# Patient Record
Sex: Female | Born: 1980 | Race: White | Hispanic: No | Smoking: Current every day smoker
Health system: Southern US, Community
[De-identification: ages and names within clinical notes are randomized; demographics above are authoritative.]

## PROBLEM LIST (undated history)

## (undated) DIAGNOSIS — K760 Fatty (change of) liver, not elsewhere classified: Secondary | ICD-10-CM

## (undated) DIAGNOSIS — C801 Malignant (primary) neoplasm, unspecified: Secondary | ICD-10-CM

## (undated) DIAGNOSIS — I2510 Atherosclerotic heart disease of native coronary artery without angina pectoris: Secondary | ICD-10-CM

## (undated) DIAGNOSIS — R569 Unspecified convulsions: Secondary | ICD-10-CM

## (undated) DIAGNOSIS — G4700 Insomnia, unspecified: Secondary | ICD-10-CM

## (undated) DIAGNOSIS — F419 Anxiety disorder, unspecified: Secondary | ICD-10-CM

## (undated) DIAGNOSIS — E785 Hyperlipidemia, unspecified: Secondary | ICD-10-CM

## (undated) DIAGNOSIS — M549 Dorsalgia, unspecified: Secondary | ICD-10-CM

## (undated) DIAGNOSIS — G4730 Sleep apnea, unspecified: Secondary | ICD-10-CM

## (undated) DIAGNOSIS — F329 Major depressive disorder, single episode, unspecified: Secondary | ICD-10-CM

## (undated) DIAGNOSIS — R87619 Unspecified abnormal cytological findings in specimens from cervix uteri: Secondary | ICD-10-CM

## (undated) DIAGNOSIS — F319 Bipolar disorder, unspecified: Secondary | ICD-10-CM

## (undated) DIAGNOSIS — M1990 Unspecified osteoarthritis, unspecified site: Secondary | ICD-10-CM

## (undated) DIAGNOSIS — K219 Gastro-esophageal reflux disease without esophagitis: Secondary | ICD-10-CM

## (undated) DIAGNOSIS — F32A Depression, unspecified: Secondary | ICD-10-CM

## (undated) DIAGNOSIS — M5020 Other cervical disc displacement, unspecified cervical region: Secondary | ICD-10-CM

## (undated) DIAGNOSIS — F4310 Post-traumatic stress disorder, unspecified: Secondary | ICD-10-CM

## (undated) DIAGNOSIS — K589 Irritable bowel syndrome without diarrhea: Secondary | ICD-10-CM

## (undated) HISTORY — PX: ABDOMINAL HYSTERECTOMY: SHX81

## (undated) HISTORY — DX: Hyperlipidemia, unspecified: E78.5

## (undated) HISTORY — DX: Major depressive disorder, single episode, unspecified: F32.9

## (undated) HISTORY — DX: Irritable bowel syndrome, unspecified: K58.9

## (undated) HISTORY — DX: Unspecified convulsions: R56.9

## (undated) HISTORY — DX: Other cervical disc displacement, unspecified cervical region: M50.20

## (undated) HISTORY — DX: Insomnia, unspecified: G47.00

## (undated) HISTORY — DX: Unspecified osteoarthritis, unspecified site: M19.90

## (undated) HISTORY — DX: Bipolar disorder, unspecified: F31.9

## (undated) HISTORY — DX: Anxiety disorder, unspecified: F41.9

## (undated) HISTORY — PX: TUBAL LIGATION: SHX77

## (undated) HISTORY — DX: Unspecified abnormal cytological findings in specimens from cervix uteri: R87.619

## (undated) HISTORY — DX: Fatty (change of) liver, not elsewhere classified: K76.0

## (undated) HISTORY — DX: Dorsalgia, unspecified: M54.9

## (undated) HISTORY — DX: Depression, unspecified: F32.A

## (undated) HISTORY — PX: CHOLECYSTECTOMY: SHX55

## (undated) HISTORY — DX: Post-traumatic stress disorder, unspecified: F43.10

---

## 2016-09-15 DIAGNOSIS — G40919 Epilepsy, unspecified, intractable, without status epilepticus: Secondary | ICD-10-CM | POA: Insufficient documentation

## 2017-04-10 ENCOUNTER — Encounter: Payer: Self-pay | Admitting: Family Medicine

## 2017-04-10 ENCOUNTER — Ambulatory Visit: Payer: Medicaid Other | Admitting: Family Medicine

## 2017-04-10 ENCOUNTER — Ambulatory Visit
Admission: RE | Admit: 2017-04-10 | Discharge: 2017-04-10 | Disposition: A | Discharge status: Home / Self Care | Payer: Medicaid Other | Source: Ambulatory Visit | Attending: Family Medicine | Admitting: Family Medicine

## 2017-04-10 VITALS — BP 130/70 | HR 64 | Ht 71.0 in | Wt 256.0 lb

## 2017-04-10 DIAGNOSIS — Z7689 Persons encountering health services in other specified circumstances: Secondary | ICD-10-CM

## 2017-04-10 DIAGNOSIS — G40209 Localization-related (focal) (partial) symptomatic epilepsy and epileptic syndromes with complex partial seizures, not intractable, without status epilepticus: Secondary | ICD-10-CM

## 2017-04-10 DIAGNOSIS — M542 Cervicalgia: Secondary | ICD-10-CM

## 2017-04-10 DIAGNOSIS — M545 Low back pain, unspecified: Secondary | ICD-10-CM

## 2017-04-10 DIAGNOSIS — F319 Bipolar disorder, unspecified: Secondary | ICD-10-CM | POA: Diagnosis not present

## 2017-04-10 DIAGNOSIS — Z8669 Personal history of other diseases of the nervous system and sense organs: Secondary | ICD-10-CM

## 2017-04-10 MED ORDER — CYCLOBENZAPRINE HCL 10 MG PO TABS: 10.0000 mg | ORAL_TABLET | Freq: Every day | ORAL | 1 refills | Status: DC

## 2017-04-10 NOTE — Progress Notes (Signed)
Name: Christina Jacobson   MRN: [id number]    DOB: November 03, 1980   Date:04/10/2017       Progress Note  Subjective  Chief Complaint  Chief Complaint  Patient presents with  . establishing care    needed new PCP closer to [facility]    Patient to establish care with primary care physician.   Back Pain  This is a chronic problem. The current episode started more than 1 year ago. The problem occurs daily. The problem has been waxing and waning since onset. The pain is present in the lumbar spine. The quality of the pain is described as aching. The pain is moderate. Associated symptoms include abdominal pain, headaches, numbness, paresis, paresthesias and tingling. Pertinent negatives include no bladder incontinence, bowel incontinence, chest pain, dysuria, fever, weakness or weight loss. ([place]bs) Risk factors: remote trauma. She has tried NSAIDs and muscle relaxant for the symptoms. The treatment provided mild relief.  Neck Pain   This is a chronic problem. The current episode started more than 1 year ago. The problem occurs daily. The pain is associated with a remote injury. The pain is present in the midline. Associated symptoms include headaches, numbness, paresis and tingling. Pertinent negatives include no chest pain, fever, weakness or weight loss. She has tried NSAIDs and muscle relaxants for the symptoms. The treatment provided moderate relief.    No problem-specific Assessment & Plan notes found for this encounter.   Past Medical History:  Diagnosis Date  . Abnormal Pap smear of cervix   . Anxiety   . Arthritis   . 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 Bipolar disorder ([place])   . Depression   . Fatty liver   . Herniated disc, cervical   . Hyperlipidemia   . Insomnia   . Irritable bowel syndrome   . PTSD (post-traumatic stress disorder)   . Seizures ([place])    petit mal    Past Surgical History:  Procedure Laterality Date  . TUBAL LIGATION       Family History  Problem Relation Age of Onset  . Diabetes Father   . Diabetes Paternal Grandmother     Social History   Socioeconomic History  . Marital status: Widowed    Spouse name: Not on file  . Number of children: Not on file  . Years of education: Not on file  . Highest education level: Not on file  Social Needs  . Financial resource strain: Not on file  . Food insecurity - worry: Not on file  . Food insecurity - inability: Not on file  . Transportation needs - medical: Not on file  . Transportation needs - non-medical: Not on file  Occupational History  . Not on file  Tobacco Use  . Smoking status: Current Every Day Smoker    Types: Cigarettes  . Smokeless tobacco: Never Used  Substance and Sexual Activity  . Alcohol use: No    Frequency: Never  . Drug use: No  . Sexual activity: Not on file  Other Topics Concern  . Not on file  Social History Narrative  . Not on file    Allergies not on file  Outpatient Medications Prior to Visit  Medication Sig Dispense Refill  . CRANBERRY EXTRACT PO Take 2 tablets daily by mouth.    . cyanocobalamin 2000 MCG tablet Take 2,000 mcg daily by mouth.    . cyclobenzaprine (FLEXERIL) 10 MG tablet Take 1 tablet at bedtime  by mouth.    . Diphenhydramine-APAP, sleep, (TYLENOL PM EXTRA STRENGTH PO) Take 500 mg daily by mouth.    Marland Kitchen HYDROcodone-acetaminophen (NORCO/VICODIN) 5-325 MG tablet Take 1 tablet every 8 (eight) hours as needed by mouth for moderate pain.    . Ibuprofen (ADVIL) 200 MG CAPS Take 1 capsule as needed by mouth. otc    . Lactobacillus (ACIDOPHILUS EXTRA STRENGTH) CAPS Take 1 capsule daily by mouth.    . levETIRAcetam (KEPPRA) 1000 MG tablet Take 500 mg 2 (two) times daily by mouth.    . Omega 3 1000 MG CAPS Take 1 capsule daily by mouth.    Marland Kitchen omeprazole (PRILOSEC) 20 MG capsule Take 1 capsule daily by mouth.     No facility-administered medications prior to visit.     Review of Systems  Constitutional:  Negative for chills, fever, malaise/fatigue and weight loss.  HENT: Negative for ear discharge, ear pain and sore throat.   Eyes: Negative for blurred vision.  Respiratory: Negative for cough, sputum production, shortness of breath and wheezing.   Cardiovascular: Negative for chest pain, palpitations and leg swelling.  Gastrointestinal: Positive for abdominal pain. Negative for blood in stool, bowel incontinence, constipation, diarrhea, heartburn, melena and nausea.  Genitourinary: Negative for bladder incontinence, dysuria, frequency, hematuria and urgency.  Musculoskeletal: Positive for back pain and neck pain. Negative for joint pain and myalgias.  Skin: Negative for rash.  Neurological: Positive for tingling, numbness, headaches and paresthesias. Negative for dizziness, sensory change, focal weakness and weakness.  Endo/Heme/Allergies: Negative for environmental allergies and polydipsia. Does not bruise/bleed easily.  Psychiatric/Behavioral: Negative for depression and suicidal ideas. The patient is not nervous/anxious and does not have insomnia.      Objective  Vitals:   04/10/17 1500  BP: 130/70  Pulse: 64  Weight: 256 lb (116.1 kg)  Height: 5\' 11"  (1.803 m)    Physical Exam  Constitutional: She is well-developed, well-nourished, and in no distress. No distress.  HENT:  Head: Normocephalic and atraumatic.  Right Ear: Tympanic membrane, external ear and ear canal normal. Decreased hearing is noted.  Left Ear: Tympanic membrane, external ear and ear canal normal. Decreased hearing is noted.  Nose: Nose normal.  Mouth/Throat: Oropharynx is clear and moist and mucous membranes are normal. No oropharyngeal exudate, posterior oropharyngeal edema or posterior oropharyngeal erythema.  Eyes: Conjunctivae and EOM are normal. Pupils are equal, round, and reactive to light. Right eye exhibits no discharge. Left eye exhibits no discharge.  Neck: Normal range of motion. Neck supple. No JVD  present. No thyromegaly present.  Cardiovascular: Normal rate, regular rhythm, S1 normal, S2 normal, normal heart sounds and intact distal pulses. Exam reveals no gallop and no friction rub.  No murmur heard. Pulmonary/Chest: Effort normal and breath sounds normal. She has no wheezes. She has no rales.  Abdominal: Soft. Bowel sounds are normal. She exhibits no mass. There is no tenderness. There is no guarding.  Musculoskeletal: Normal range of motion. She exhibits no edema.  Lymphadenopathy:    She has no cervical adenopathy.  Neurological: She is alert. She has normal strength, normal reflexes and intact cranial nerves. A sensory deficit is present.  Skin: Skin is warm and dry. She is not diaphoretic.  Psychiatric: Mood and affect normal.  Nursing note and vitals reviewed.     Assessment & Plan  Problem List Items Addressed This Visit    None      No orders of the defined types were placed in this encounter.  Await xrays- send to physical therapy depending on results   Dr. Otilio Miu [facility]  04/10/17

## 2017-04-11 ENCOUNTER — Other Ambulatory Visit: Payer: Self-pay

## 2017-04-11 DIAGNOSIS — M545 Low back pain, unspecified: Secondary | ICD-10-CM

## 2017-04-11 DIAGNOSIS — M542 Cervicalgia: Secondary | ICD-10-CM

## 2017-04-30 ENCOUNTER — Ambulatory Visit: Payer: Medicaid Other | Attending: Family Medicine | Admitting: Physical Therapy

## 2017-04-30 DIAGNOSIS — M542 Cervicalgia: Secondary | ICD-10-CM | POA: Diagnosis present

## 2017-04-30 DIAGNOSIS — G8929 Other chronic pain: Secondary | ICD-10-CM | POA: Diagnosis present

## 2017-04-30 DIAGNOSIS — M6281 Muscle weakness (generalized): Secondary | ICD-10-CM | POA: Diagnosis present

## 2017-04-30 DIAGNOSIS — M545 Low back pain: Secondary | ICD-10-CM | POA: Diagnosis present

## 2017-05-02 ENCOUNTER — Encounter: Payer: Self-pay | Admitting: Physical Therapy
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
## 2017-05-07 ENCOUNTER — Ambulatory Visit: Payer: Medicaid Other | Admitting: Physical Therapy

## 2017-05-07 DIAGNOSIS — M542 Cervicalgia: Principal | ICD-10-CM

## 2017-05-07 DIAGNOSIS — M545 Low back pain: Secondary | ICD-10-CM

## 2017-05-07 DIAGNOSIS — G8929 Other chronic pain: Secondary | ICD-10-CM

## 2017-05-07 DIAGNOSIS — M6281 Muscle weakness (generalized): Secondary | ICD-10-CM

## 2017-05-09 ENCOUNTER — Encounter: Payer: Self-pay | Admitting: Physical Therapy
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
## 2017-05-14 ENCOUNTER — Ambulatory Visit: Payer: Medicaid Other | Admitting: Physical Therapy

## 2017-05-14 DIAGNOSIS — G8929 Other chronic pain: Secondary | ICD-10-CM

## 2017-05-14 DIAGNOSIS — M542 Cervicalgia: Principal | ICD-10-CM

## 2017-05-14 DIAGNOSIS — M6281 Muscle weakness (generalized): Secondary | ICD-10-CM

## 2017-05-14 DIAGNOSIS — M545 Low back pain, unspecified: Secondary | ICD-10-CM

## 2017-05-16 ENCOUNTER — Encounter: Payer: Self-pay | Admitting: Physical Therapy
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
## 2017-05-21 ENCOUNTER — Encounter: Payer: Self-pay | Admitting: Physical Therapy

## 2017-05-21 ENCOUNTER — Ambulatory Visit: Payer: Medicaid Other | Admitting: Physical Therapy

## 2017-05-21 DIAGNOSIS — M545 Low back pain, unspecified: Secondary | ICD-10-CM

## 2017-05-21 DIAGNOSIS — M542 Cervicalgia: Principal | ICD-10-CM

## 2017-05-21 DIAGNOSIS — M6281 Muscle weakness (generalized): Secondary | ICD-10-CM

## 2017-05-21 DIAGNOSIS — G8929 Other chronic pain: Secondary | ICD-10-CM

## 2017-05-23 NOTE — Therapy (Signed)
[place] [facility] [address] [place], [place], [address] Phone: [phone]   Fax:  [phone]  Physical Therapy Treatment  Patient Details  Name: Christina Jacobson MRN: [id number] Date of Birth: [birth year] Referring Provider: Dr. Otilio Miu   Encounter Date: 05/21/2017  PT End of Session - 05/23/17 1459    Visit Number  4    Number of Visits  4    Date for PT Re-Evaluation  05/28/17    PT Start Time  1103    PT Stop Time  1209    PT Time Calculation (min)  66 min    Activity Tolerance  Patient tolerated treatment well;Patient limited by pain    Behavior During Therapy  [facility] for tasks assessed/performed       Past Medical History:  Diagnosis Date  . Abnormal Pap smear of cervix   . Anxiety   . Arthritis   . 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 Bipolar disorder ([place])   . Depression   . Fatty liver   . Herniated disc, cervical   . Hyperlipidemia   . Insomnia   . Irritable bowel syndrome   . PTSD (post-traumatic stress disorder)   . Seizures ([place])    petit mal    Past Surgical History:  Procedure Laterality Date  . TUBAL LIGATION      There were no vitals filed for this visit.      Pt. c/o 9-10/10 neck pain currently and states back/B knee pain is not too bad.         Treatment:   There.ex.: Reviewed supine core stability ex.: bolster bridging with focus on maintaining core activation/ marching/ clamshell/ trunk rotn. Standing lumbar/ seated cervical AROM (all planes)- pain tolerable. Prone press-ups 10x. See new HEP.  Reviewed goals.    Manual tx.: Supine cervical/ UT/ levator stretches 5x each with holds. Supine LE/lumbar stretches. Prone grade II-III PA mobs. To low thoracic/lumbar spine (unilateral) 1x20 sec. L/R shoulder stretches in supine positions (all planes).    MH to back during prone mobs.        Pt. has shown limited improvement with neck pain  over past month.  Pt. has good cervical/lumbar AROM and has been issued progressive core stability/ B UE ex. program.  Pt. reports no pain improvement with manual cervical stretches/ mobilizations.  Pt. encouraged to continue with HEP and contact MD if high levels of pain persist over next few weeks.  Discharge from PT at this time.       PT Education - 05/23/17 1458    Education provided  Yes    Education Details  B UE ex. program/ posture correction    Person(s) Educated  Patient    Methods  Explanation;Demonstration;Handout    Comprehension  Verbalized understanding;Returned demonstration          PT Long Term Goals - 05/23/17 1505      PT LONG TERM GOAL #1   Title  Pt. I with HEP to increase B UE/LE strength to grossly 5/5 MMT to improve pain-free mobility.      Baseline  B UE/ LE strength grossly 4+.5 MMT.  Limited compliance with core stability ex.     Time  4    Period  Weeks    Status  Partially Met      PT LONG TERM GOAL #2   Title  Pt. will decrease NDI to <50%  to improve self-perceived disability.      Baseline  NDI: 80% on 12/5.   NDI: 66% (some improvement noted)      Time  4    Period  Weeks    Status  Partially Met      PT LONG TERM GOAL #3   Title  Pt. will decrease MODI to <40% to improve self-perceived disability.      Baseline  MODI: 63% on 12/5.    MODI: 74% (worse since initial evaluation)- no pain in back reported while completing MODI today.      Time  4    Period  Weeks    Status  Not Met      PT LONG TERM GOAL #4   Title  Pt. will reports <5/10 neck/back pain at worst with household chores/ sleeping position at night.      Baseline  10/10 pain in neck/back with daily tasks and sleeping.      Time  4    Period  Weeks    Status  Not Met            Plan - 05/23/17 1500    Clinical Impression Statement  Pt. has shown limited improvement with neck pain over past month.  Pt. has good cervical/lumbar AROM and has been issued progressive core  stability/ B UE ex. program.  Pt. reports no pain improvement with manual cervical stretches/ mobilizations.  Pt. encouraged to continue with HEP and contact MD if high levels of pain persist over next few weeks.  Discharge from PT at this time.       Clinical Presentation  Unstable    Clinical Decision Making  Moderate    Rehab Potential  Fair    PT Frequency  1x / week    PT Duration  4 weeks    PT Treatment/Interventions  ADLs/Self Care Home Management;Aquatic Therapy;Electrical Stimulation;Cryotherapy;Moist Heat;Gait training;Balance training;Neuromuscular re-education;Functional mobility training;Therapeutic activities;Therapeutic exercise;Patient/family education;Manual techniques;Passive range of motion;Dry needling    PT Next Visit Plan  Discharge visit.  Pt. limited by high levels of pain.      PT Home Exercise Plan  Page 1-2 TrA ex.        Patient will benefit from skilled therapeutic intervention in order to improve the following deficits and impairments:  Pain, Improper body mechanics, Decreased mobility, Postural dysfunction, Decreased activity tolerance, Decreased endurance, Decreased range of motion, Decreased strength, Impaired flexibility  Visit Diagnosis: Chronic neck pain  Chronic midline low back pain without sciatica  Muscle weakness (generalized)     Problem List There are no active problems to display for this patient.  Pura Spice, PT, DPT # [phone] 05/23/2017, 3:10 PM  [place] [facility] [address] [place], [place], [address] Phone: [phone]   Fax:  [phone]  Name: Christina Jacobson MRN: [id number] Date of Birth: Jun 01, 1980

## 2017-06-27 ENCOUNTER — Ambulatory Visit (INDEPENDENT_AMBULATORY_CARE_PROVIDER_SITE_OTHER): Payer: Medicaid Other | Admitting: Family Medicine

## 2017-06-27 ENCOUNTER — Encounter: Payer: Self-pay | Admitting: Family Medicine

## 2017-06-27 VITALS — BP 120/80 | HR 88 | Ht 71.0 in | Wt 202.0 lb

## 2017-06-27 DIAGNOSIS — Z23 Encounter for immunization: Secondary | ICD-10-CM | POA: Diagnosis not present

## 2017-06-27 DIAGNOSIS — J40 Bronchitis, not specified as acute or chronic: Secondary | ICD-10-CM

## 2017-06-27 DIAGNOSIS — J0100 Acute maxillary sinusitis, unspecified: Secondary | ICD-10-CM

## 2017-06-27 MED ORDER — AZITHROMYCIN 250 MG PO TABS: ORAL_TABLET | ORAL | 0 refills | Status: DC

## 2017-06-27 MED ORDER — GUAIFENESIN-CODEINE 100-10 MG/5ML PO SYRP: 5.0000 mL | ORAL_SOLUTION | Freq: Three times a day (TID) | ORAL | 0 refills | Status: DC | PRN

## 2017-06-27 NOTE — Progress Notes (Signed)
Name: Christina Jacobson   MRN: [id number]    DOB: 09/12/1980   Date:06/27/2017       Progress Note  Subjective  Chief Complaint  Chief Complaint  Patient presents with  . Sinusitis    cough- yellow production, cong, sore throat    Sinusitis  This is a new problem. The current episode started in the past 7 days. The problem has been gradually worsening since onset. There has been no fever. The pain is moderate. Associated symptoms include congestion, coughing, diaphoresis, shortness of breath, sinus pressure, sneezing and a sore throat. Pertinent negatives include no chills, ear pain, headaches, hoarse voice, neck pain or swollen glands. Treatments tried: zyrtec. The treatment provided no relief.  Cough  This is a new problem. The current episode started in the past 7 days. The problem has been gradually worsening. The cough is productive of purulent sputum (yellow). Associated symptoms include a sore throat, shortness of breath and wheezing. Pertinent negatives include no chest pain, chills, ear congestion, ear pain, fever, headaches, heartburn, hemoptysis, myalgias, nasal congestion, postnasal drip, rash, rhinorrhea or weight loss. She has tried nothing for the symptoms. There is no history of asthma or environmental allergies.    No problem-specific Assessment & Plan notes found for this encounter.   Past Medical History:  Diagnosis Date  . Abnormal Pap smear of cervix   . Anxiety   . Arthritis   . 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 Bipolar disorder ([place])   . Depression   . Fatty liver   . Herniated disc, cervical   . Hyperlipidemia   . Insomnia   . Irritable bowel syndrome   . PTSD (post-traumatic stress disorder)   . Seizures ([place])    petit mal    Past Surgical History:  Procedure Laterality Date  . TUBAL LIGATION      Family History  Problem Relation Age of Onset  . Diabetes Father   . Diabetes Paternal Grandmother     Social  History   Socioeconomic History  . Marital status: Widowed    Spouse name: Not on file  . Number of children: Not on file  . Years of education: Not on file  . Highest education level: Not on file  Social Needs  . Financial resource strain: Not on file  . Food insecurity - worry: Not on file  . Food insecurity - inability: Not on file  . Transportation needs - medical: Not on file  . Transportation needs - non-medical: Not on file  Occupational History  . Not on file  Tobacco Use  . Smoking status: Current Every Day Smoker    Types: Cigarettes  . Smokeless tobacco: Never Used  Substance and Sexual Activity  . Alcohol use: No    Frequency: Never  . Drug use: No  . Sexual activity: Not on file  Other Topics Concern  . Not on file  Social History Narrative  . Not on file    Allergies  Allergen Reactions  . Penicillins Rash    Outpatient Medications Prior to Visit  Medication Sig Dispense Refill  . CRANBERRY EXTRACT PO Take 2 tablets daily by mouth.    . cyanocobalamin 2000 MCG tablet Take 2,000 mcg daily by mouth.    . cyclobenzaprine (FLEXERIL) 10 MG tablet Take 1 tablet (10 mg total) at bedtime by mouth. 30 tablet 1  . Diphenhydramine-APAP, sleep, (TYLENOL PM EXTRA STRENGTH PO) Take 500 mg  daily by mouth.    . divalproex (DEPAKOTE) 500 MG DR tablet Take 1 tablet by mouth 2 (two) times daily. Dr Manuella Ghazi    . Ibuprofen (ADVIL) 200 MG CAPS Take 1 capsule as needed by mouth. otc    . Lactobacillus (ACIDOPHILUS EXTRA STRENGTH) CAPS Take 1 capsule daily by mouth.    . Omega 3 1000 MG CAPS Take 1 capsule daily by mouth.    Marland Kitchen omeprazole (PRILOSEC) 20 MG capsule Take 1 capsule daily by mouth.    Marland Kitchen HYDROcodone-acetaminophen (NORCO/VICODIN) 5-325 MG tablet Take 1 tablet every 8 (eight) hours as needed by mouth for moderate pain.    Marland Kitchen levETIRAcetam (KEPPRA) 1000 MG tablet Take 500 mg 2 (two) times daily by mouth.     No facility-administered medications prior to visit.     Review  of Systems  Constitutional: Positive for diaphoresis. Negative for chills, fever, malaise/fatigue and weight loss.  HENT: Positive for congestion, sinus pressure, sneezing and sore throat. Negative for ear discharge, ear pain, hoarse voice, postnasal drip and rhinorrhea.   Eyes: Negative for blurred vision.  Respiratory: Positive for cough, shortness of breath and wheezing. Negative for hemoptysis and sputum production.   Cardiovascular: Negative for chest pain, palpitations and leg swelling.  Gastrointestinal: Negative for abdominal pain, blood in stool, constipation, diarrhea, heartburn, melena and nausea.  Genitourinary: Negative for dysuria, frequency, hematuria and urgency.  Musculoskeletal: Negative for back pain, joint pain, myalgias and neck pain.  Skin: Negative for rash.  Neurological: Negative for dizziness, tingling, sensory change, focal weakness and headaches.  Endo/Heme/Allergies: Negative for environmental allergies and polydipsia. Does not bruise/bleed easily.  Psychiatric/Behavioral: Negative for depression and suicidal ideas. The patient is not nervous/anxious and does not have insomnia.      Objective  Vitals:   06/27/17 0821  BP: 120/80  Pulse: 88  Weight: 202 lb (91.6 kg)  Height: 5\' 11"  (1.803 m)    Physical Exam  Constitutional: She is well-developed, well-nourished, and in no distress. No distress.  HENT:  Head: Normocephalic and atraumatic.  Right Ear: Tympanic membrane, external ear and ear canal normal.  Left Ear: External ear and ear canal normal. A middle ear effusion is present.  Nose: Mucosal edema present. Right sinus exhibits maxillary sinus tenderness. Right sinus exhibits no frontal sinus tenderness. Left sinus exhibits no maxillary sinus tenderness and no frontal sinus tenderness.  Mouth/Throat: Posterior oropharyngeal erythema present. No oropharyngeal exudate or posterior oropharyngeal edema.  Postnasal yellow  Eyes: Conjunctivae and EOM are  normal. Pupils are equal, round, and reactive to light. Right eye exhibits no discharge. Left eye exhibits no discharge.  Neck: Normal range of motion. Neck supple. No JVD present. No thyromegaly present.  Cardiovascular: Normal rate, regular rhythm, normal heart sounds and intact distal pulses. Exam reveals no gallop and no friction rub.  No murmur heard. Pulmonary/Chest: Effort normal. No respiratory distress. She has no wheezes. She has no rales.  Abdominal: Soft. Bowel sounds are normal. She exhibits no mass. There is no tenderness. There is no guarding.  Musculoskeletal: Normal range of motion. She exhibits no edema.  Lymphadenopathy:    She has no cervical adenopathy.  Neurological: She is alert. She has normal reflexes.  Skin: Skin is warm and dry. She is not diaphoretic.  Psychiatric: Mood and affect normal.  Nursing note and vitals reviewed.     Assessment & Plan  Problem List Items Addressed This Visit    None    Visit Diagnoses  Acute maxillary sinusitis, recurrence not specified    -  Primary   Relevant Medications   azithromycin (ZITHROMAX) 250 MG tablet   guaiFENesin-codeine (ROBITUSSIN AC) 100-10 MG/5ML syrup   Bronchitis       Relevant Medications   guaiFENesin-codeine (ROBITUSSIN AC) 100-10 MG/5ML syrup   Need for diphtheria-tetanus-pertussis (Tdap) vaccine       Relevant Orders   Tdap vaccine greater than or equal to 7yo IM (Completed)      Meds ordered this encounter  Medications  . azithromycin (ZITHROMAX) 250 MG tablet    Sig: 2 today then 1 a day for 4 day    Dispense:  6 tablet    Refill:  0  . guaiFENesin-codeine (ROBITUSSIN AC) 100-10 MG/5ML syrup    Sig: Take 5 mLs by mouth 3 (three) times daily as needed for cough.    Dispense:  100 mL    Refill:  0      Dr. Otilio Miu [facility]  06/27/17

## 2017-06-27 NOTE — Patient Instructions (Signed)
Health Risks of Smoking  Smoking cigarettes is very bad for your health. Tobacco smoke has over 200 known poisons in it. It contains the poisonous gases nitrogen oxide and carbon monoxide. There are over 60 chemicals in tobacco smoke that cause cancer.  Smoking is difficult to quit because a chemical in tobacco, called nicotine, causes addiction or dependence. When you smoke and inhale, nicotine is absorbed rapidly into the bloodstream through your lungs. Both inhaled and non-inhaled nicotine may be addictive.  What are the risks of cigarette smoke?  Cigarette smokers have an increased risk of many serious medical problems, including:  · Lung cancer.  · Lung disease, such as pneumonia, bronchitis, and emphysema.  · Chest pain (angina) and heart attack because the heart is not getting enough oxygen.  · Heart disease and peripheral blood vessel disease.  · High blood pressure (hypertension).  · Stroke.  · Oral cancer, including cancer of the lip, mouth, or voice box.  · Bladder cancer.  · Pancreatic cancer.  · Cervical cancer.  · Pregnancy complications, including premature birth.  · Stillbirths and smaller newborn babies, birth defects, and genetic damage to sperm.  · Early menopause.  · Lower estrogen level for women.  · Infertility.  · Facial wrinkles.  · Blindness.  · Increased risk of broken bones (fractures).  · Senile dementia.  · Stomach ulcers and internal bleeding.  · Delayed wound healing and increased risk of complications during surgery.  · Even smoking lightly shortens your life expectancy by several years.    Because of secondhand smoke exposure, children of smokers have an increased risk of the following:  · Sudden infant death syndrome (SIDS).  · Respiratory infections.  · Lung cancer.  · Heart disease.  · Ear infections.    What are the benefits of quitting?  There are many health benefits of quitting smoking. Here are some of them:   · Within days of quitting smoking, your risk of having a heart attack decreases, your blood flow improves, and your lung capacity improves. Blood pressure, pulse rate, and breathing patterns start returning to normal soon after quitting.  · Within months, your lungs may clear up completely.  · Quitting for 10 years reduces your risk of developing lung cancer and heart disease to almost that of a nonsmoker.  · People who quit may see an improvement in their overall quality of life.    How do I quit smoking?  Smoking is an addiction with both physical and psychological effects, and longtime habits can be hard to change. Your health care provider can recommend:  · Programs and community resources, which may include group support, education, or talk therapy.  · Prescription medicines to help reduce cravings.  · Nicotine replacement products, such as patches, gum, and nasal sprays. Use these products only as directed. Do not replace cigarette smoking with electronic cigarettes, which are commonly called e-cigarettes. The safety of e-cigarettes is not known, and some may contain harmful chemicals.  · A combination of two or more of these methods.    Where to find more information:  · American Lung Association: www.lung.org  · American Cancer Society: www.cancer.org  Summary  · Smoking cigarettes is very bad for your health. Cigarette smokers have an increased risk of many serious medical problems, including several cancers, heart disease, and stroke.  · Smoking is an addiction with both physical and psychological effects, and longtime habits can be hard to change.  · By stopping right away, you   can greatly reduce the risk of medical problems for you and your family.  · To help you quit smoking, your health care provider can recommend programs, community resources, prescription medicines, and nicotine replacement products such as patches, gum, and nasal sprays.   This information is not intended to replace advice given to you by your health care provider. Make sure you discuss any questions you have with your health care provider.  Document Released: 06/20/2004 Document Revised: 05/17/2016 Document Reviewed: 05/17/2016  Elsevier Interactive Patient Education © 2017 [employer]

## 2017-09-02 ENCOUNTER — Ambulatory Visit: Payer: Medicaid Other | Admitting: Family Medicine

## 2017-09-02 ENCOUNTER — Encounter: Payer: Self-pay | Admitting: Family Medicine

## 2017-09-02 VITALS — BP 120/82 | HR 80 | Ht 71.0 in | Wt 204.0 lb

## 2017-09-02 DIAGNOSIS — Z8742 Personal history of other diseases of the female genital tract: Secondary | ICD-10-CM

## 2017-09-02 DIAGNOSIS — G40209 Localization-related (focal) (partial) symptomatic epilepsy and epileptic syndromes with complex partial seizures, not intractable, without status epilepticus: Secondary | ICD-10-CM

## 2017-09-02 DIAGNOSIS — Z87898 Personal history of other specified conditions: Secondary | ICD-10-CM

## 2017-09-02 DIAGNOSIS — F319 Bipolar disorder, unspecified: Secondary | ICD-10-CM

## 2017-09-02 DIAGNOSIS — F82 Specific developmental disorder of motor function: Secondary | ICD-10-CM | POA: Diagnosis not present

## 2017-09-02 DIAGNOSIS — N938 Other specified abnormal uterine and vaginal bleeding: Secondary | ICD-10-CM

## 2017-09-02 NOTE — Patient Instructions (Signed)
Dysfunctional Uterine Bleeding °Dysfunctional uterine bleeding is abnormal bleeding from the uterus. Dysfunctional uterine bleeding includes: °· A period that comes earlier or later than usual. °· A period that is lighter, heavier, or has blood clots. °· Bleeding between periods. °· Skipping one or more periods. °· Bleeding after sexual intercourse. °· Bleeding after menopause. ° °Follow these instructions at home: °Pay attention to any changes in your symptoms. Follow these instructions to help with your condition: °Eating and drinking °· Eat well-balanced meals. Include foods that are high in iron, such as liver, meat, shellfish, green leafy vegetables, and eggs. °· If you become constipated: °? Drink plenty of water. °? Eat fruits and vegetables that are high in water and fiber, such as spinach, carrots, raspberries, apples, and mango. °Medicines °· Take over-the-counter and prescription medicines only as told by your health care provider. °· Do not change medicines without talking with your health care provider. °· Aspirin or medicines that contain aspirin may make the bleeding worse. Do not take those medicines: °? During the week before your period. °? During your period. °· If you were prescribed iron pills, take them as told by your health care provider. Iron pills help to replace iron that your body loses because of this condition. °Activity °· If you need to change your sanitary pad or tampon more than one time every 2 hours: °? Lie in bed with your feet raised (elevated). °? Place a cold pack on your lower abdomen. °? Rest as much as possible until the bleeding stops or slows down. °· Do not try to lose weight until the bleeding has stopped and your blood iron level is back to normal. °Other Instructions °· For two months, write down: °? When your period starts. °? When your period ends. °? When any abnormal bleeding occurs. °? What problems you notice. °· Keep all follow up visits as told by your health  care provider. This is important. °Contact a health care provider if: °· You get light-headed or weak. °· You have nausea and vomiting. °· You cannot eat or drink without vomiting. °· You feel dizzy or have diarrhea while you are taking medicines. °· You are taking birth control pills or hormones, and you want to change them or stop taking them. °Get help right away if: °· You develop a fever or chills. °· You need to change your sanitary pad or tampon more than one time per hour. °· Your bleeding becomes heavier, or your flow contains clots more often. °· You develop pain in your abdomen. °· You lose consciousness. °· You develop a rash. °This information is not intended to replace advice given to you by your health care provider. Make sure you discuss any questions you have with your health care provider. °Document Released: 05/10/2000 Document Revised: 10/19/2015 Document Reviewed: 08/08/2014 °Elsevier Interactive Patient Education © 2018 [employer] Inc[employer] ° °

## 2017-09-02 NOTE — Progress Notes (Signed)
Name: Christina Jacobson   MRN: [id number]    DOB: 10-Mar-1981   Date:09/02/2017       Progress Note  Subjective  Chief Complaint  Chief Complaint  Patient presents with  . abnormal bleeding    Had a "normal period" in March- started spotting after/ comes and goes. Needs referral to GYN    Vaginal Bleeding  The patient's primary symptoms include missed menses and vaginal bleeding. The patient's pertinent negatives include no genital itching, genital lesions, genital odor, genital rash, pelvic pain or vaginal discharge. This is a new problem. The current episode started 1 to 4 weeks ago (2012/ass with bad pap smear/this episode 10 days). The problem occurs intermittently. The problem has been waxing and waning. The patient is experiencing no pain. She is not pregnant. Associated symptoms include back pain. Pertinent negatives include no abdominal pain, anorexia, chills, constipation, diarrhea, discolored urine, dysuria, fever, flank pain, frequency, headaches, hematuria, joint pain, joint swelling, nausea, painful intercourse, rash, sore throat, urgency or vomiting. The vaginal discharge was normal. The vaginal bleeding is spotting. She has not been passing clots. She has not been passing tissue. Nothing aggravates the symptoms. She has tried nothing for the symptoms. She is sexually active. No, her partner does not have an STD. She uses tubal ligation for contraception. Her menstrual history has been irregular. Her past medical history is significant for a gynecological surgery. There is no history of an abdominal surgery, a Cesarean section, an ectopic pregnancy, endometriosis, herpes simplex, menorrhagia, metrorrhagia, miscarriage, ovarian cysts, perineal abscess, PID, an STD, a terminated pregnancy or vaginosis. (Ctyotherapy)  Neurologic Problem  The patient's primary symptoms include clumsiness, focal sensory loss and focal weakness. The patient's pertinent negatives include no altered mental status, loss of  balance, memory loss, near-syncope, slurred speech, syncope, visual change or weakness. This is a chronic problem. Episode onset: hands /1 weekago. The neurological problem developed suddenly. The problem has been gradually worsening since onset. There was upper extremity (unable to grasp) focality noted. Associated symptoms include back pain. Pertinent negatives include no abdominal pain, bladder incontinence, bowel incontinence, chest pain, confusion, diaphoresis, dizziness, fatigue, fever, headaches, light-headedness, nausea, neck pain, palpitations, shortness of breath, vertigo or vomiting. Past treatments include nothing. There is no history of a bleeding disorder, a clotting disorder, a CVA, dementia, head trauma, liver disease, mood changes or seizures. (Ctyotherapy)    No problem-specific Assessment & Plan notes found for this encounter.   Past Medical History:  Diagnosis Date  . Abnormal Pap smear of cervix   . Anxiety   . Arthritis   . 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 Bipolar disorder ([place])   . Depression   . Fatty liver   . Herniated disc, cervical   . Hyperlipidemia   . Insomnia   . Irritable bowel syndrome   . PTSD (post-traumatic stress disorder)   . Seizures ([place])    petit mal    Past Surgical History:  Procedure Laterality Date  . TUBAL LIGATION      Family History  Problem Relation Age of Onset  . Diabetes Father   . Diabetes Paternal Grandmother     Social History   Socioeconomic History  . Marital status: Widowed    Spouse name: Not on file  . Number of children: Not on file  . Years of education: Not on file  . Highest education level: Not on file  Occupational History  . Not  on file  Social Needs  . Financial resource strain: Not on file  . Food insecurity:    Worry: Not on file    Inability: Not on file  . Transportation needs:    Medical: Not on file    Non-medical: Not on file  Tobacco Use  .  Smoking status: Current Every Day Smoker    Types: Cigarettes  . Smokeless tobacco: Never Used  Substance and Sexual Activity  . Alcohol use: No    Frequency: Never  . Drug use: No  . Sexual activity: Not on file  Lifestyle  . Physical activity:    Days per week: Not on file    Minutes per session: Not on file  . Stress: Not on file  Relationships  . Social connections:    Talks on phone: Not on file    Gets together: Not on file    Attends religious service: Not on file    Active member of club or organization: Not on file    Attends meetings of clubs or organizations: Not on file    Relationship status: Not on file  . Intimate partner violence:    Fear of current or ex partner: Not on file    Emotionally abused: Not on file    Physically abused: Not on file    Forced sexual activity: Not on file  Other Topics Concern  . Not on file  Social History Narrative  . Not on file    Allergies  Allergen Reactions  . Penicillins Rash    Outpatient Medications Prior to Visit  Medication Sig Dispense Refill  . CRANBERRY EXTRACT PO Take 2 tablets daily by mouth.    . cyanocobalamin 2000 MCG tablet Take 2,000 mcg daily by mouth.    . cyclobenzaprine (FLEXERIL) 10 MG tablet Take 1 tablet (10 mg total) at bedtime by mouth. 30 tablet 1  . Diphenhydramine-APAP, sleep, (TYLENOL PM EXTRA STRENGTH PO) Take 500 mg daily by mouth.    . divalproex (DEPAKOTE) 500 MG DR tablet Take 1 tablet by mouth 2 (two) times daily. Dr Manuella Ghazi    . Ibuprofen (ADVIL) 200 MG CAPS Take 1 capsule as needed by mouth. otc    . Lactobacillus (ACIDOPHILUS EXTRA STRENGTH) CAPS Take 1 capsule daily by mouth.    . Omega 3 1000 MG CAPS Take 1 capsule daily by mouth.    Marland Kitchen omeprazole (PRILOSEC) 20 MG capsule Take 1 capsule daily by mouth.    Marland Kitchen azithromycin (ZITHROMAX) 250 MG tablet 2 today then 1 a day for 4 day 6 tablet 0  . guaiFENesin-codeine (ROBITUSSIN AC) 100-10 MG/5ML syrup Take 5 mLs by mouth 3 (three) times  daily as needed for cough. 100 mL 0   No facility-administered medications prior to visit.     Review of Systems  Constitutional: Negative for chills, diaphoresis, fatigue, fever, malaise/fatigue and weight loss.  HENT: Negative for ear discharge, ear pain and sore throat.   Eyes: Negative for blurred vision.  Respiratory: Negative for cough, sputum production, shortness of breath and wheezing.   Cardiovascular: Negative for chest pain, palpitations, leg swelling and near-syncope.  Gastrointestinal: Negative for abdominal pain, anorexia, blood in stool, bowel incontinence, constipation, diarrhea, heartburn, melena, nausea and vomiting.  Genitourinary: Positive for missed menses and vaginal bleeding. Negative for bladder incontinence, dysuria, flank pain, frequency, hematuria, menorrhagia, pelvic pain, urgency and vaginal discharge.  Musculoskeletal: Positive for back pain. Negative for joint pain, myalgias and neck pain.  Skin: Negative for rash.  Neurological: Positive for focal weakness. Negative for dizziness, vertigo, tingling, sensory change, syncope, weakness, light-headedness, headaches and loss of balance.  Endo/Heme/Allergies: Negative for environmental allergies and polydipsia. Does not bruise/bleed easily.  Psychiatric/Behavioral: Negative for confusion, depression, memory loss and suicidal ideas. The patient is not nervous/anxious and does not have insomnia.      Objective  Vitals:   09/02/17 0847  BP: 120/82  Pulse: 80  Weight: 204 lb (92.5 kg)  Height: 5\' 11"  (1.803 m)    Physical Exam  Constitutional: She is well-developed, well-nourished, and in no distress. No distress.  HENT:  Head: Normocephalic and atraumatic.  Right Ear: External ear normal.  Left Ear: External ear normal.  Nose: Nose normal.  Mouth/Throat: Oropharynx is clear and moist.  Eyes: Pupils are equal, round, and reactive to light. Conjunctivae and EOM are normal. Right eye exhibits no discharge.  Left eye exhibits no discharge.  Neck: Normal range of motion. Neck supple. No JVD present. No thyromegaly present.  Cardiovascular: Normal rate, regular rhythm, normal heart sounds and intact distal pulses. Exam reveals no gallop and no friction rub.  No murmur heard. Pulmonary/Chest: Effort normal and breath sounds normal. She has no wheezes. She has no rales.  Abdominal: Soft. Bowel sounds are normal. She exhibits no mass. There is no tenderness. There is no guarding.  Musculoskeletal: Normal range of motion. She exhibits no edema.  Lymphadenopathy:    She has no cervical adenopathy.  Neurological: She is alert. She has normal sensation, normal reflexes and intact cranial nerves. She displays weakness. No cranial nerve deficit or sensory deficit.  Unable to grasp  Skin: Skin is warm and dry. She is not diaphoretic.  Psychiatric: Mood and affect normal.  Nursing note and vitals reviewed.     Assessment & Plan  Problem List Items Addressed This Visit      Other   Bipolar 1 disorder ([place])    Other Visit Diagnoses    Dysfunctional uterine bleeding    -  Primary   Relevant Orders   Ambulatory referral to Gynecology   Partial symptomatic epilepsy with complex partial seizures, not intractable, without status epilepticus ([place])   (Chronic)     Relevant Orders   Ambulatory referral to Neurology   Hx of abnormal cervical Pap smear       Relevant Orders   Ambulatory referral to Gynecology   Motor skill disorder       Relevant Orders   Ambulatory referral to Neurology     sched GYN for 10/14/17 @ 3:15 in [facility] with Dr Leafy Ro No orders of the defined types were placed in this encounter.     Dr. [facility]  09/02/17

## 2019-11-23 NOTE — H&P (Signed)
Christina Jacobson is a 39 y.o. female here for scheduled TVH and bilateral salpingectomy . this patient has a long history of  menorrhagia . Was on Depo provera stopped and restarted and heavy bleeding with clot passage size of  Baseballs .  Menses have been irregular all her life .   H/o of cervical dysplasia and s/p cryotx in past . No dyspareunia  EMBX neg and U/s normal   G12 P 3  All SVD . S/P BTL   seizure d/o on depakote 500 mg BID. Last sz 4 yrs ago . No recent changes to her sz medication  Past Medical History:  has a past medical history of Arthritis, Depression, Dysmenorrhea, IBS (irritable bowel syndrome) (2017), Migraine headache, Seizures (CMS-HCC), and Sleep apnea.  Past Surgical History:  has a past surgical history that includes Tubal ligation. Family History: family history includes Cancer in her maternal grandfather; Developmental delay in her son; Diabetes in her paternal grandmother. Social History:  reports that she has been smoking. She has been smoking about 1.00 pack per day. She has never used smokeless tobacco. She reports that she does not drink alcohol and does not use drugs. OB/GYN History:          OB History    Gravida  12   Para  3   Term  3   Preterm      AB  9   Living  3     SAB      TAB      Ectopic      Molar      Multiple      Live Births             Allergies: is allergic to penicillins. Medications:  Current Outpatient Medications:  .  cranberry 400 mg Cap, Take by mouth, Disp: , Rfl:  .  divalproex (DEPAKOTE) 250 MG DR tablet, Take 250mg  (1 tablet) two times a day for two weeks then take 500mg  (2 tablets) two times a day., Disp: 120 tablet, Rfl: 0 .  escitalopram oxalate (LEXAPRO) 10 MG tablet, Take 10 mg by mouth once daily, Disp: , Rfl:  .  medroxyPROGESTERone (DEPO-PROVERA) 150 mg/mL injection, Inject 1 mL (150 mg total) into the muscle every 3 (three) months, Disp: 1 mL, Rfl: 3 .  NAPROXEN ORAL, Take by mouth   , Disp: ,  Rfl:  .  omeprazole (PRILOSEC) 20 MG DR capsule, TAKE ONE CAPSULE BY MOUTH DAILY FOR REFLUX, Disp: , Rfl: 2 .  cyclobenzaprine (FLEXERIL) 10 MG tablet, TAKE 1 TABLET BY MOUTH AT BEDTIME AS NEEDED FOR MUSCLE PAIN (Patient not taking: Reported on 10/14/2019), Disp: , Rfl: 1 .  docosahexaenoic acid-epa 120-180 mg Cap, Take by mouth (Patient not taking: Reported on 10/14/2019  ), Disp: , Rfl:  .  ergocalciferol, vitamin D2, (VITAMIN D2 ORAL), Take by mouth. (Patient not taking: Reported on 10/14/2019  ), Disp: , Rfl:  .  levETIRAcetam (KEPPRA) 1000 MG tablet, Take 0.5 tablets (500 mg total) by mouth 2 (two) times daily. (Patient not taking: Reported on 05/12/2017 ), Disp: 90 tablet, Rfl: 3 .  omega-3 fatty acids/fish oil (OMEGA 3 FISH OIL ORAL), Take by mouth. (Patient not taking: Reported on 10/14/2019  ), Disp: , Rfl:  .  Saccharomyces boulardii (FLORASTOR) 250 mg capsule, Take 250 mg by mouth 2 (two) times daily (Patient not taking: Reported on 11/01/2019  ), Disp: , Rfl:  .  sertraline (ZOLOFT) 50 MG tablet, Take 50  mg by mouth once daily (Patient not taking: Reported on 10/14/2019  ), Disp: , Rfl:   Review of Systems: General:                      No fatigue or weight loss Eyes:                           No vision changes Ears:                            No hearing difficulty Respiratory:                No cough or shortness of breath Pulmonary:                  No asthma or shortness of breath Cardiovascular:           No chest pain, palpitations, dyspnea on exertion Gastrointestinal:          No abdominal bloating, chronic diarrhea, constipations, masses, pain or hematochezia Genitourinary:             No hematuria, dysuria, abnormal vaginal discharge, pelvic pain, +Menometrorrhagia Lymphatic:                   No swollen lymph nodes Musculoskeletal:         No muscle weakness Neurologic:                  No extremity weakness, syncope, seizure disorder Psychiatric:                  No history of  depression, delusions or suicidal/homicidal ideation    Exam:      Vitals:   11/11/19 1505  BP: 128/86  Pulse: 80    Body mass index is 31.8 kg/m.  WDWN white/ female in NAD   Lungs: CTA  CV : RRR without murmur   Neck:  no thyromegaly Abdomen: soft , no mass, normal active bowel sounds,  non-tender, no rebound tenderness Pelvic: tanner stage 5 ,  External genitalia: vulva /labia no lesions Urethra: no prolapse Vagina: normal physiologic d/c, adequate room for hysterectomy TVH  Cervix: no lesions, no cervical motion tenderness   Uterus: normal size shape and contour, non-tender Adnexa: no mass,  non-tender   Rectovaginal:  Impression:   The primary encounter diagnosis was Menorrhagia with irregular cycle. A diagnosis of History of cervical dysplasia was also pertinent to this visit.    Plan:   After thorough discussion the patient has opted for definitive surgery TVH + bilateral salpingectomy. I have explained the procedure in detail     Neurology appt for surgical clearance     Caroline Sauger, MD

## 2019-11-30 ENCOUNTER — Other Ambulatory Visit: Payer: Self-pay

## 2019-11-30 ENCOUNTER — Encounter
Admission: RE | Admit: 2019-11-30 | Discharge: 2019-11-30 | Disposition: A | Discharge status: Home / Self Care | Payer: Medicaid Other | Source: Ambulatory Visit | Attending: Obstetrics and Gynecology | Admitting: Obstetrics and Gynecology

## 2019-11-30 DIAGNOSIS — Z20822 Contact with and (suspected) exposure to covid-19: Secondary | ICD-10-CM | POA: Insufficient documentation

## 2019-11-30 DIAGNOSIS — Z01812 Encounter for preprocedural laboratory examination: Secondary | ICD-10-CM | POA: Insufficient documentation

## 2019-11-30 HISTORY — DX: Malignant (primary) neoplasm, unspecified: C80.1

## 2019-11-30 HISTORY — DX: Sleep apnea, unspecified: G47.30

## 2019-11-30 HISTORY — DX: Gastro-esophageal reflux disease without esophagitis: K21.9

## 2019-11-30 NOTE — Patient Instructions (Addendum)
Your procedure is scheduled on: 12-03-19 FRIDAY Report to Same Day Surgery 2nd floor medical mall [facility] Entrance-take elevator on left to 2nd floor.  Check in with surgery information desk.) To find out your arrival time please call [phone] between 1PM - 3PM on 12-02-19 THURSDAY  Remember: Instructions that are not followed completely may result in serious medical risk, up to and including death, or upon the discretion of your surgeon and anesthesiologist your surgery may need to be rescheduled.    _x___ 1. Do not eat food after midnight the night before your procedure. NO GUM OR CANDY AFTER MIDNIGHT. You may drink clear liquids up to 2 hours before you are scheduled to arrive at the hospital for your procedure.  Do not drink clear liquids within 2 hours of your scheduled arrival to the hospital.  Clear liquids include  --Water or Apple juice without pulp  --Gatorade  --Black Coffee or Clear Tea (No milk, no creamers, do not add anything to the coffee or Tea-ok to add sugar)  _X___Ensure clear carbohydrate drink-FINISH DRINK 2 HOURS PRIOR TO ARRIVAL TIME TO HOSPITAL THE DAY OF YOUR SURGERY    __x__ 2. No Alcohol for 24 hours before or after surgery.   __x__3. No Smoking or e-cigarettes for 24 prior to surgery.  Do not use any chewable tobacco products for at least 6 hour prior to surgery   ____  4. Bring all medications with you on the day of surgery if instructed.    __x__ 5. Notify your doctor if there is any change in your medical condition     (cold, fever, infections).    x___6. On the morning of surgery brush your teeth with toothpaste and water.  You may rinse your mouth with mouth wash if you wish.  Do not swallow any toothpaste or mouthwash.   Do not wear jewelry, make-up, hairpins, clips or nail polish.  Do not wear lotions, powders, or perfumes. You may wear deodorant.  Do not shave 48 hours prior to surgery. Men may shave face and neck.  Do not bring valuables  to the hospital.    [facility] is not responsible for any belongings or valuables.               Contacts, dentures or bridgework may not be worn into surgery.  Leave your suitcase in the car. After surgery it may be brought to your room.  For patients admitted to the hospital, discharge time is determined by your treatment team.  _  Patients discharged the day of surgery will not be allowed to drive home.  You will need someone to drive you home and stay with you the night of your procedure.    Please read over the following fact sheets that you were given:   [facility] Preparing for Surgery/INCENTIVE SPIROMETER INSTRUCTIONS  _x___ TAKE THE FOLLOWING MEDICATION THE MORNING OF SURGERY WITH A SMALL SIP OF WATER. These include:  1. DEPAKOTE (DIVALPROEX)  2. PRILOSEC (OMEPRAZOLE)  3.  4.  5.  6.  ____Fleets enema or Magnesium Citrate as directed.   ____ Use CHG Soap or sage wipes as directed on instruction sheet   ____ Use inhalers on the day of surgery and bring to hospital day of surgery  ____ Stop Metformin and Janumet 2 days prior to surgery.    ____ Take 1/2 of usual insulin dose the night before surgery and none on the morning surgery.   ____ Follow recommendations from Cardiologist,  Pulmonologist or PCP regarding stopping Aspirin, Coumadin, Plavix ,Eliquis, Effient, or Pradaxa, and Pletal.  X____Stop Anti-inflammatories such as Advil, Aleve, Ibuprofen, Motrin, Naproxen, Naprosyn, Goodies powders or aspirin products NOW-OK to take Tylenol    _x___ Stop supplements until after surgery-STOP CRANBERRY NOW-YOU MAY RESUME AFTER YOUR SURGERY   ____ Bring C-Pap to the hospital.

## 2019-12-01 ENCOUNTER — Other Ambulatory Visit
Admission: RE | Admit: 2019-12-01 | Discharge: 2019-12-01 | Disposition: A | Discharge status: Home / Self Care | Payer: Medicaid Other | Source: Ambulatory Visit | Attending: Obstetrics and Gynecology | Admitting: Obstetrics and Gynecology

## 2019-12-01 DIAGNOSIS — Z01812 Encounter for preprocedural laboratory examination: Secondary | ICD-10-CM | POA: Diagnosis not present

## 2019-12-01 LAB — COMPREHENSIVE METABOLIC PANEL
ALT: 33 U/L (ref 0–44)
AST: 29 U/L (ref 15–41)
Albumin: 4.1 g/dL (ref 3.5–5.0)
Alkaline Phosphatase: 93 U/L (ref 38–126)
Anion gap: 13 (ref 5–15)
BUN: 5 mg/dL — ABNORMAL LOW (ref 6–20)
CO2: 23 mmol/L (ref 22–32)
Calcium: 8.7 mg/dL — ABNORMAL LOW (ref 8.9–10.3)
Chloride: 102 mmol/L (ref 98–111)
Creatinine, Ser: 0.75 mg/dL (ref 0.44–1.00)
GFR calc Af Amer: >60 mL/min (ref >60)
GFR calc non Af Amer: >60 mL/min (ref >60)
Glucose, Bld: 147 mg/dL — ABNORMAL HIGH (ref 70–99)
Potassium: 3.4 mmol/L — ABNORMAL LOW (ref 3.5–5.1)
Sodium: 138 mmol/L (ref 135–145)
Total Bilirubin: 0.6 mg/dL (ref 0.3–1.2)
Total Protein: 7.3 g/dL (ref 6.5–8.1)

## 2019-12-01 LAB — CBC
HCT: 40 % (ref 36.0–46.0)
Hemoglobin: 14 g/dL (ref 12.0–15.0)
MCH: 29.9 pg (ref 26.0–34.0)
MCHC: 35 g/dL (ref 30.0–36.0)
MCV: 85.3 fL (ref 80.0–100.0)
Platelets: 231 10*3/uL (ref 150–400)
RBC: 4.69 MIL/uL (ref 3.87–5.11)
RDW: 12.4 % (ref 11.5–15.5)
WBC: 8.2 10*3/uL (ref 4.0–10.5)
nRBC: 0 % (ref 0.0–0.2)

## 2019-12-01 LAB — TYPE AND SCREEN
ABO/RH(D): O POS
Antibody Screen: NEGATIVE

## 2019-12-01 LAB — SARS CORONAVIRUS 2 (TAT 6-24 HRS): SARS Coronavirus 2: NEGATIVE

## 2019-12-02 ENCOUNTER — Other Ambulatory Visit: Payer: Self-pay | Admitting: Obstetrics and Gynecology

## 2019-12-03 ENCOUNTER — Encounter: Payer: Self-pay | Admitting: Obstetrics and Gynecology

## 2019-12-03 ENCOUNTER — Ambulatory Visit
Admission: RE | Admit: 2019-12-03 | Discharge: 2019-12-03 | Disposition: A | Discharge status: Home / Self Care | Payer: Medicaid Other | Source: Ambulatory Visit | Attending: Obstetrics and Gynecology | Admitting: Obstetrics and Gynecology

## 2019-12-03 ENCOUNTER — Ambulatory Visit: Payer: Medicaid Other | Admitting: Anesthesiology

## 2019-12-03 ENCOUNTER — Other Ambulatory Visit: Payer: Self-pay

## 2019-12-03 ENCOUNTER — Encounter
Admission: RE | Disposition: A | Discharge status: Home / Self Care | Payer: Self-pay | Source: Ambulatory Visit | Attending: Obstetrics and Gynecology

## 2019-12-03 DIAGNOSIS — Z880 Allergy status to penicillin: Secondary | ICD-10-CM | POA: Diagnosis not present

## 2019-12-03 DIAGNOSIS — Z87410 Personal history of cervical dysplasia: Secondary | ICD-10-CM | POA: Insufficient documentation

## 2019-12-03 DIAGNOSIS — F17210 Nicotine dependence, cigarettes, uncomplicated: Secondary | ICD-10-CM | POA: Insufficient documentation

## 2019-12-03 DIAGNOSIS — R569 Unspecified convulsions: Secondary | ICD-10-CM | POA: Insufficient documentation

## 2019-12-03 DIAGNOSIS — F319 Bipolar disorder, unspecified: Secondary | ICD-10-CM | POA: Diagnosis not present

## 2019-12-03 DIAGNOSIS — M1990 Unspecified osteoarthritis, unspecified site: Secondary | ICD-10-CM | POA: Diagnosis not present

## 2019-12-03 DIAGNOSIS — Z791 Long term (current) use of non-steroidal anti-inflammatories (NSAID): Secondary | ICD-10-CM | POA: Insufficient documentation

## 2019-12-03 DIAGNOSIS — G4730 Sleep apnea, unspecified: Secondary | ICD-10-CM | POA: Insufficient documentation

## 2019-12-03 DIAGNOSIS — Z79899 Other long term (current) drug therapy: Secondary | ICD-10-CM | POA: Diagnosis not present

## 2019-12-03 DIAGNOSIS — N920 Excessive and frequent menstruation with regular cycle: Secondary | ICD-10-CM | POA: Insufficient documentation

## 2019-12-03 DIAGNOSIS — Z85828 Personal history of other malignant neoplasm of skin: Secondary | ICD-10-CM | POA: Diagnosis not present

## 2019-12-03 DIAGNOSIS — Z793 Long term (current) use of hormonal contraceptives: Secondary | ICD-10-CM | POA: Diagnosis not present

## 2019-12-03 HISTORY — PX: OOPHORECTOMY: SHX6387

## 2019-12-03 HISTORY — PX: CYSTOSCOPY: SHX5120

## 2019-12-03 HISTORY — PX: VAGINAL HYSTERECTOMY: SHX2639

## 2019-12-03 HISTORY — PX: BILATERAL SALPINGECTOMY: SHX5743

## 2019-12-03 LAB — ABO/RH: ABO/RH(D): O POS

## 2019-12-03 LAB — POCT PREGNANCY, URINE: Preg Test, Ur: NEGATIVE

## 2019-12-03 SURGERY — HYSTERECTOMY, VAGINAL
Anesthesia: General

## 2019-12-03 MED ORDER — PROPOFOL 10 MG/ML IV BOLUS
INTRAVENOUS | Status: DC | PRN
  Administered 2019-12-03: 200 mg via INTRAVENOUS

## 2019-12-03 MED ORDER — CEFAZOLIN SODIUM-DEXTROSE 2-4 GM/100ML-% IV SOLN
INTRAVENOUS | Status: AC
  Filled 2019-12-03: qty 100

## 2019-12-03 MED ORDER — EPHEDRINE 5 MG/ML INJ
INTRAVENOUS | Status: AC
  Filled 2019-12-03: qty 10

## 2019-12-03 MED ORDER — CHLORHEXIDINE GLUCONATE 0.12 % MT SOLN
15.0000 mL | Freq: Once | OROMUCOSAL | Status: AC
  Administered 2019-12-03: 15 mL via OROMUCOSAL

## 2019-12-03 MED ORDER — MIDAZOLAM HCL 2 MG/2ML IJ SOLN
INTRAMUSCULAR | Status: DC | PRN
  Administered 2019-12-03: 2 mg via INTRAVENOUS

## 2019-12-03 MED ORDER — FENTANYL CITRATE (PF) 250 MCG/5ML IJ SOLN
INTRAMUSCULAR | Status: DC | PRN
  Administered 2019-12-03 (×3): 50 ug via INTRAVENOUS

## 2019-12-03 MED ORDER — DEXMEDETOMIDINE HCL IN NACL 200 MCG/50ML IV SOLN
INTRAVENOUS | Status: DC | PRN
Start: 2019-12-03 — End: 2019-12-03
  Administered 2019-12-03: 12 ug via INTRAVENOUS

## 2019-12-03 MED ORDER — POVIDONE-IODINE 10 % EX SWAB: 2.0000 "application " | Freq: Once | CUTANEOUS | Status: DC

## 2019-12-03 MED ORDER — LIDOCAINE-EPINEPHRINE 1 %-1:100000 IJ SOLN
INTRAMUSCULAR | Status: AC
  Filled 2019-12-03: qty 1

## 2019-12-03 MED ORDER — FENTANYL CITRATE (PF) 250 MCG/5ML IJ SOLN
INTRAMUSCULAR | Status: AC
  Filled 2019-12-03: qty 5

## 2019-12-03 MED ORDER — GABAPENTIN 300 MG PO CAPS
ORAL_CAPSULE | ORAL | Status: AC
  Filled 2019-12-03: qty 1

## 2019-12-03 MED ORDER — FENTANYL CITRATE (PF) 100 MCG/2ML IJ SOLN
25.0000 ug | INTRAMUSCULAR | Status: DC | PRN
  Administered 2019-12-03: 50 ug via INTRAVENOUS

## 2019-12-03 MED ORDER — DEXAMETHASONE SODIUM PHOSPHATE 10 MG/ML IJ SOLN
INTRAMUSCULAR | Status: DC | PRN
  Administered 2019-12-03: 10 mg via INTRAVENOUS

## 2019-12-03 MED ORDER — KETAMINE HCL 50 MG/ML IJ SOLN
INTRAMUSCULAR | Status: DC | PRN
Start: 2019-12-03 — End: 2019-12-03
  Administered 2019-12-03: 50 mg via INTRAMUSCULAR

## 2019-12-03 MED ORDER — CEFAZOLIN SODIUM-DEXTROSE 2-4 GM/100ML-% IV SOLN
2.0000 g | Freq: Once | INTRAVENOUS | Status: AC
  Administered 2019-12-03: 2 g via INTRAVENOUS

## 2019-12-03 MED ORDER — ONDANSETRON 4 MG PO TBDP: 4.0000 mg | ORAL_TABLET | Freq: Four times a day (QID) | ORAL | Status: DC | PRN

## 2019-12-03 MED ORDER — KETOROLAC TROMETHAMINE 30 MG/ML IJ SOLN
INTRAMUSCULAR | Status: DC | PRN
  Administered 2019-12-03: 30 mg via INTRAVENOUS

## 2019-12-03 MED ORDER — OXYCODONE HCL 5 MG PO TABS
ORAL_TABLET | ORAL | Status: AC
  Administered 2019-12-03: 5 mg via ORAL
  Filled 2019-12-03: qty 1

## 2019-12-03 MED ORDER — PHENYLEPHRINE HCL (PRESSORS) 10 MG/ML IV SOLN
INTRAVENOUS | Status: DC | PRN
  Administered 2019-12-03 (×2): 50 ug via INTRAVENOUS
  Administered 2019-12-03 (×5): 100 ug via INTRAVENOUS

## 2019-12-03 MED ORDER — MIDAZOLAM HCL 2 MG/2ML IJ SOLN
INTRAMUSCULAR | Status: AC
  Filled 2019-12-03: qty 2

## 2019-12-03 MED ORDER — OXYCODONE HCL 5 MG/5ML PO SOLN: 5.0000 mg | Freq: Once | ORAL | Status: AC | PRN

## 2019-12-03 MED ORDER — LACTATED RINGERS IV SOLN: INTRAVENOUS | Status: DC

## 2019-12-03 MED ORDER — ROCURONIUM BROMIDE 10 MG/ML (PF) SYRINGE
PREFILLED_SYRINGE | INTRAVENOUS | Status: AC
  Filled 2019-12-03: qty 10

## 2019-12-03 MED ORDER — FENTANYL CITRATE (PF) 100 MCG/2ML IJ SOLN
INTRAMUSCULAR | Status: AC
  Administered 2019-12-03: 25 ug via INTRAVENOUS
  Filled 2019-12-03: qty 2

## 2019-12-03 MED ORDER — OXYCODONE HCL 5 MG PO TABS: 5.0000 mg | ORAL_TABLET | Freq: Once | ORAL | Status: AC

## 2019-12-03 MED ORDER — PROPOFOL 10 MG/ML IV BOLUS
INTRAVENOUS | Status: AC
  Filled 2019-12-03: qty 20

## 2019-12-03 MED ORDER — ACETAMINOPHEN 500 MG PO TABS
ORAL_TABLET | ORAL | Status: AC
  Filled 2019-12-03: qty 2

## 2019-12-03 MED ORDER — OXYCODONE-ACETAMINOPHEN 5-325 MG PO TABS: 1.0000 | ORAL_TABLET | ORAL | Status: DC | PRN

## 2019-12-03 MED ORDER — ROCURONIUM BROMIDE 100 MG/10ML IV SOLN
INTRAVENOUS | Status: DC | PRN
  Administered 2019-12-03 (×2): 10 mg via INTRAVENOUS
  Administered 2019-12-03: 50 mg via INTRAVENOUS
  Administered 2019-12-03: 10 mg via INTRAVENOUS

## 2019-12-03 MED ORDER — ONDANSETRON HCL 4 MG/2ML IJ SOLN
INTRAMUSCULAR | Status: AC
  Filled 2019-12-03: qty 2

## 2019-12-03 MED ORDER — FLUORESCEIN SODIUM 10 % IV SOLN
INTRAVENOUS | Status: AC
  Filled 2019-12-03: qty 5

## 2019-12-03 MED ORDER — LIDOCAINE HCL (PF) 2 % IJ SOLN
INTRAMUSCULAR | Status: AC
  Filled 2019-12-03: qty 5

## 2019-12-03 MED ORDER — LIDOCAINE HCL (CARDIAC) PF 100 MG/5ML IV SOSY
PREFILLED_SYRINGE | INTRAVENOUS | Status: DC | PRN
  Administered 2019-12-03: 100 mg via INTRAVENOUS

## 2019-12-03 MED ORDER — LIDOCAINE-EPINEPHRINE 1 %-1:100000 IJ SOLN
INTRAMUSCULAR | Status: DC | PRN
  Administered 2019-12-03: 8 mL

## 2019-12-03 MED ORDER — CHLORHEXIDINE GLUCONATE 0.12 % MT SOLN
OROMUCOSAL | Status: AC
  Filled 2019-12-03: qty 15

## 2019-12-03 MED ORDER — ORAL CARE MOUTH RINSE: 15.0000 mL | Freq: Once | OROMUCOSAL | Status: AC

## 2019-12-03 MED ORDER — OXYCODONE HCL 5 MG PO TABS
5.0000 mg | ORAL_TABLET | Freq: Once | ORAL | Status: AC | PRN
  Administered 2019-12-03: 5 mg via ORAL

## 2019-12-03 MED ORDER — GABAPENTIN 300 MG PO CAPS
300.0000 mg | ORAL_CAPSULE | ORAL | Status: AC
  Administered 2019-12-03: 300 mg via ORAL

## 2019-12-03 MED ORDER — DEXAMETHASONE SODIUM PHOSPHATE 10 MG/ML IJ SOLN
INTRAMUSCULAR | Status: AC
  Filled 2019-12-03: qty 1

## 2019-12-03 MED ORDER — EPHEDRINE SULFATE 50 MG/ML IJ SOLN
INTRAMUSCULAR | Status: DC | PRN
  Administered 2019-12-03: 5 mg via INTRAVENOUS
  Administered 2019-12-03: 10 mg via INTRAVENOUS

## 2019-12-03 MED ORDER — OXYCODONE HCL 5 MG PO TABS
ORAL_TABLET | ORAL | Status: AC
  Filled 2019-12-03: qty 1

## 2019-12-03 MED ORDER — ACETAMINOPHEN 500 MG PO TABS
1000.0000 mg | ORAL_TABLET | ORAL | Status: AC
  Administered 2019-12-03: 1000 mg via ORAL

## 2019-12-03 MED ORDER — SUGAMMADEX SODIUM 200 MG/2ML IV SOLN
INTRAVENOUS | Status: DC | PRN
  Administered 2019-12-03: 500 mg via INTRAVENOUS

## 2019-12-03 SURGICAL SUPPLY — 36 items
BAG URINE DRAIN 2000ML AR STRL (UROLOGICAL SUPPLIES) ×6 IMPLANT
CANISTER SUCT 1200ML W/VALVE (MISCELLANEOUS) ×6 IMPLANT
CATH FOLEY 2WAY  5CC 16FR (CATHETERS) ×2
CATH ROBINSON RED A/P 16FR (CATHETERS) ×6 IMPLANT
CATH URTH 16FR FL 2W BLN LF (CATHETERS) ×4 IMPLANT
COVER WAND RF STERILE (DRAPES) ×6 IMPLANT
DRAPE PERI LITHO V/GYN (MISCELLANEOUS) ×6 IMPLANT
DRAPE SURG 17X11 SM STRL (DRAPES) ×6 IMPLANT
DRAPE UNDER BUTTOCK W/FLU (DRAPES) ×6 IMPLANT
ELECT REM PT RETURN 9FT ADLT (ELECTROSURGICAL) ×6
ELECTRODE REM PT RTRN 9FT ADLT (ELECTROSURGICAL) ×4 IMPLANT
GAUZE 4X4 16PLY RFD (DISPOSABLE) ×6 IMPLANT
GLOVE SURG SYN 8.0 (GLOVE) ×6 IMPLANT
GOWN STRL REUS W/ TWL LRG LVL3 (GOWN DISPOSABLE) ×12 IMPLANT
GOWN STRL REUS W/ TWL XL LVL3 (GOWN DISPOSABLE) ×4 IMPLANT
GOWN STRL REUS W/TWL LRG LVL3 (GOWN DISPOSABLE) ×6
GOWN STRL REUS W/TWL XL LVL3 (GOWN DISPOSABLE) ×2
KIT TURNOVER CYSTO (KITS) ×6 IMPLANT
LABEL OR SOLS (LABEL) ×6 IMPLANT
NEEDLE HYPO 22GX1.5 SAFETY (NEEDLE) ×6 IMPLANT
PACK BASIN MINOR (MISCELLANEOUS) ×6 IMPLANT
PAD OB MATERNITY 4.3X12.25 (PERSONAL CARE ITEMS) ×6 IMPLANT
PAD PREP 24X41 OB/GYN DISP (PERSONAL CARE ITEMS) ×6 IMPLANT
SOL PREP PROV IODINE SCRUB 4OZ (MISCELLANEOUS) IMPLANT
SOL PREP PVP 2OZ (MISCELLANEOUS) ×6
SOLUTION PREP PVP 2OZ (MISCELLANEOUS) ×4 IMPLANT
SURGILUBE 2OZ TUBE FLIPTOP (MISCELLANEOUS) ×6 IMPLANT
SUT PDS 2-0 27IN (SUTURE) ×6 IMPLANT
SUT VIC AB 0 CT1 27 (SUTURE) ×6
SUT VIC AB 0 CT1 27XCR 8 STRN (SUTURE) ×12 IMPLANT
SUT VIC AB 0 CT1 36 (SUTURE) ×12 IMPLANT
SUT VIC AB 2-0 SH 27 (SUTURE) ×6
SUT VIC AB 2-0 SH 27XBRD (SUTURE) ×12 IMPLANT
SYR 10ML LL (SYRINGE) ×6 IMPLANT
SYR CONTROL 10ML LL (SYRINGE) ×6 IMPLANT
WATER STERILE IRR 1000ML POUR (IV SOLUTION) ×6 IMPLANT

## 2019-12-03 NOTE — Brief Op Note (Signed)
12/03/2019  3:08 PM  PATIENT:  Christina Jacobson  39 y.o. female  PRE-OPERATIVE DIAGNOSIS:  menorrhagia  POST-OPERATIVE DIAGNOSIS:  menorrhagia  PROCEDURE:  Procedure(s): HYSTERECTOMY VAGINAL (N/A) BILATERAL SALPINGECTOMY (Bilateral) cystoscopy SURGEON:  Surgeon(s) and Role:    * Rama Mcclintock, Gwen Her, MD - Primary    * Benjaman Kindler, MD - Assisting  PHYSICIAN ASSISTANT: CSt  ASSISTANTS: none   ANESTHESIA:   general  EBL:  100 mL IOF 1000 cc UO 150 cc  BLOOD ADMINISTERED:none  DRAINS: none   LOCAL MEDICATIONS USED:  LIDOCAINE  and OTHER with epi   SPECIMEN:  Cervix , uterus , left ovary and right fallopian tube , left fallopian tube   DISPOSITION OF SPECIMEN:  PATHOLOGY  COUNTS:  YES  TOURNIQUET:  * No tourniquets in log *  DICTATION: .Other Dictation: Dictation Number verbal  PLAN OF CARE: Discharge to home after PACU  PATIENT DISPOSITION:  PACU - hemodynamically stable.   Delay start of Pharmacological VTE agent (>24hrs) due to surgical blood loss or risk of bleeding: not applicable

## 2019-12-03 NOTE — Anesthesia Procedure Notes (Signed)
Procedure Name: Intubation Date/Time: 12/03/2019 12:56 PM Performed by: Eben Burow, CRNA Pre-anesthesia Checklist: Patient identified, Emergency Drugs available, Suction available and Patient being monitored Patient Re-evaluated:Patient Re-evaluated prior to induction Oxygen Delivery Method: Circle system utilized Preoxygenation: Pre-oxygenation with 100% oxygen Induction Type: IV induction Ventilation: Mask ventilation without difficulty and Oral airway inserted - appropriate to patient size Laryngoscope Size: McGraph and 3 Grade View: Grade I Tube type: Oral Tube size: 7.0 mm Number of attempts: 1 Airway Equipment and Method: Stylet Placement Confirmation: ETT inserted through vocal cords under direct vision,  positive ETCO2 and breath sounds checked- equal and bilateral Secured at: 20 cm Tube secured with: Tape Dental Injury: Teeth and Oropharynx as per pre-operative assessment

## 2019-12-03 NOTE — Transfer of Care (Signed)
Immediate Anesthesia Transfer of Care Note  Patient: Christina Jacobson  Procedure(s) Performed: HYSTERECTOMY VAGINAL (N/A ) BILATERAL SALPINGECTOMY (Bilateral )  Patient Location: PACU  Anesthesia Type:General  Level of Consciousness: drowsy, patient cooperative and responds to stimulation  Airway & Oxygen Therapy: Patient Spontanous Breathing and Patient connected to face mask oxygen  Post-op Assessment: Report given to RN and Post -op Vital signs reviewed and stable  Post vital signs: Reviewed and stable  Last Vitals:  Vitals Value Taken Time  BP 133/57 12/03/19 1522  Temp    Pulse 82 12/03/19 1526  Resp 21 12/03/19 1526  SpO2 100 % 12/03/19 1526  Vitals shown include unvalidated device data.  Last Pain:  Vitals:   12/03/19 1038  TempSrc: Oral  PainSc: 8          Complications: No complications documented.

## 2019-12-03 NOTE — Progress Notes (Signed)
TVH and bilateral salpingectomy . LAbs reviewed   neg HCG  All questions answered . Proceed with surgery
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
## 2019-12-03 NOTE — Progress Notes (Signed)
Patient urinated 50 ml at 1746 and 75 ml at [place] so I bladder scanned her and she had 96 in her bladder. I will let the patient leave but I did inform her that she needs to drink more because she appears to be dehydrated and she told me she has not drank much the prior days. She stated she understood that she needs to increase her fluid intake.

## 2019-12-03 NOTE — Progress Notes (Signed)
Patient has allergy to penicillins. Dr Ouida Sills stated that it will be ok to use Ancef 2 g during surgery.

## 2019-12-03 NOTE — Discharge Instructions (Signed)
Vaginal Hysterectomy, Care After Refer to this sheet in the next few weeks. These instructions provide you with information about caring for yourself after your procedure. Your health care provider may also give you more specific instructions. Your treatment has been planned according to current medical practices, but problems sometimes occur. Call your health care provider if you have any problems or questions after your procedure. What can I expect after the procedure? After the procedure, it is common to have:  Pain.  Soreness and numbness in your incision areas.  Vaginal bleeding and discharge.  Constipation.  Temporary problems emptying the bladder.  Feelings of sadness or other emotions. Follow these instructions at home: Medicines  Take over-the-counter and prescription medicines only as told by your health care provider.  If you were prescribed an antibiotic medicine, take it as told by your health care provider. Do not stop taking the antibiotic even if you start to feel better.  Do not drive or operate heavy machinery while taking prescription pain medicine. Activity  Return to your normal activities as told by your health care provider. Ask your health care provider what activities are safe for you.  Get regular exercise as told by your health care provider. You may be told to take short walks every day and go farther each time.  Do not lift anything that is heavier than 10 lb (4.5 kg). General instructions   Do not put anything in your vagina for 6 weeks after your surgery or as told by your health care provider. This includes tampons and douches.  Do not have sex until your health care provider says you can.  Do not take baths, swim, or use a hot tub until your health care provider approves.  Drink enough fluid to keep your urine clear or pale yellow.  Do not drive for 24 hours if you were given a sedative.  Keep all follow-up visits as told by your health  care provider. This is important. Contact a health care provider if:  Your pain medicine is not helping.  You have a fever.  You have redness, swelling, or pain at your incision site.  You have blood, pus, or a bad-smelling discharge from your vagina.  You continue to have difficulty urinating. Get help right away if:  You have severe abdominal or back pain.  You have heavy bleeding from your vagina.  You have chest pain or shortness of breath. This information is not intended to replace advice given to you by your health care provider. Make sure you discuss any questions you have with your health care provider. Document Revised: 01/04/2016 Document Reviewed: 05/28/2015 Elsevier Patient Education  2020 Elsevier Inc.   AMBULATORY SURGERY  DISCHARGE INSTRUCTIONS   1) The drugs that you were given will stay in your system until tomorrow so for the next 24 hours you should not:  A) Drive an automobile B) Make any legal decisions C) Drink any alcoholic beverage   2) You may resume regular meals tomorrow.  Today it is better to start with liquids and gradually work up to solid foods.  You may eat anything you prefer, but it is better to start with liquids, then soup and crackers, and gradually work up to solid foods.   3) Please notify your doctor immediately if you have any unusual bleeding, trouble breathing, redness and pain at the surgery site, drainage, fever, or pain not relieved by medication.    4) Additional Instructions:          Please contact your physician with any problems or Same Day Surgery at [phone], Monday through Friday 6 am to 4 pm, or [place] at [place] Main number at [phone]. 

## 2019-12-03 NOTE — Anesthesia Postprocedure Evaluation (Signed)
Anesthesia Post Note  Patient: Christina Jacobson  Procedure(s) Performed: HYSTERECTOMY VAGINAL (N/A ) BILATERAL SALPINGECTOMY (Bilateral ) CYSTOSCOPY OOPHORECTOMY (Left )  Patient location during evaluation: PACU Anesthesia Type: General Level of consciousness: awake and alert and oriented Pain management: pain level controlled Vital Signs Assessment: post-procedure vital signs reviewed and stable Respiratory status: spontaneous breathing Cardiovascular status: blood pressure returned to baseline Anesthetic complications: no   No complications documented.   Last Vitals:  Vitals:   12/03/19 1537 12/03/19 1549  BP: (!) 130/56   Pulse: 89 84  Resp: (!) 22 14  Temp:    SpO2: 94% 95%    Last Pain:  Vitals:   12/03/19 1549  TempSrc:   PainSc: 10-Worst pain ever                 Juniel Groene

## 2019-12-03 NOTE — Pre-Procedure Instructions (Signed)
Patient Instructions - documented in this encounter Patient Instructions Christina Church, MD - 11/24/2019 9:45 AM EDT  Formatting of this note might be different from the original. Dear Christina Jacobson, It was our pleasure to participate in your care in person. We have typed up a summary of what we discussed.  1. History of Seizures - with likely focal seizure without convulsions, with a history of breakthrough seizure when trying to come off medication  -Continue take Depakote 500mg  two times a day for two- continuing to get medication through primary provider.   -should check CBC, CMP and Depakote level (patient gets labs at [place] [facility]).   2. History of mildly Elevated Liver Enzymes in a patient on Depakote who is monitored by another provider who does not consume Alcohol  - We will be doing labs as stated above   3. Patient has had her COVID vaccine. We are glad that you were able to take the COVID vaccine. COVID vaccine prevents you from getting the severe disease and also reduce the chances that you will pass it on to people around you. People who are not vaccinated should continue to take precautions such as wearing a mask (that creates a good seal around the nose and mouth), avoiding large crowds, indoor gatherings, etc.  4. Presurgical risk stratification: Patient is a low risk for her surgery from neurological perspective for perioperative period.  Electronically signed by Christina Church, MD at 11/24/2019 10:00 AM EDT    Plan of Treatment - documented as of this encounter Upcoming Encounters Upcoming Encounters  Date Type Specialty Care Team Description  12/21/2019 Post Op Obstetrics and Gynecology Schermerhorn, Burman Blacksmith, MD  [address]  [place] [facility]  [place], [place] [address]  [phone]  [phone] ([phone]    01/10/2020 Office Visit Obstetrics and Gynecology Ward, Juliane Lack, [place]  [place], [place] [address]  [phone]  [phone] ([address])    Bunn, Odessa Fleming, CNM  [address]  [place], [place] [address]  [phone]  [phone] (Fax)     Scheduled Orders Scheduled Orders  Name Type Priority Associated Diagnoses Order Schedule  CBC w/auto Differential (5 Part) Lab Routine High risk medication use  Expected: 12/24/2019 (Approximate), Expires: 11/23/2020  Comprehensive Metabolic Panel (CMP) Lab Routine High risk medication use  Expected: 12/24/2019 (Approximate), Expires: 11/23/2020  Valproic Acid (Depakote),S - [facility] Lab Routine High risk medication use  Expected: 12/24/2019 (Approximate), Expires: 11/23/2020  Visit Diagnoses - documented in this encounter Diagnosis  Partial symptomatic epilepsy with complex partial seizures, intractable, without status epilepticus (CMS-HCC) - Primary   High risk medication use   Discontinued Medications - documented as of this encounter Medication Sig Discontinue Reason Start Date End Date  divalproex (DEPAKOTE) 250 MG DR tablet  Take 250mg  (1 tablet) two times a day for two weeks then take 500mg  (2 tablets) two times a day. Dose adjustment 05/12/2017 11/24/2019  divalproex (DEPAKOTE) 500 MG DR tablet  Take 1 tablet (500 mg total) by mouth 3 (three) times daily No longer indicated 06/12/2017 10/10/2017  NAPROXEN ORAL  Take by mouth    11/24/2019  omega-3 fatty acids/fish oil (OMEGA 3 FISH OIL ORAL)  Take by mouth.   11/24/2019  Saccharomyces boulardii (FLORASTOR) 250 mg capsule  Take 250 mg by mouth 2 (two) times daily   11/24/2019  ergocalciferol, vitamin D2, (VITAMIN D2 ORAL)  Take by mouth    11/24/2019  docosahexaenoic acid-epa 120-180 mg Cap  Take by mouth   11/24/2019  levETIRAcetam (KEPPRA) 1000 MG tablet  Take 0.5 tablets (500 mg total) by mouth 2 (two) times daily.  09/09/2016 11/24/2019  Historical Medications - added in this encounter This list may reflect changes  made after this encounter.  Medication Sig Dispensed Refills Start Date End Date  ibuprofen-diphenhydramine cit 200-38 mg Tab  Take by mouth Take 1 tablet by mouth at bedtime  0    divalproex (DEPAKOTE) 500 MG DR tablet  Take by mouth Take 500 mg by mouth 2 (two) times daily  0 06/12/2017 11/21/2020  Images Patient Demographics  Patient Address Communication Language Race / Ethnicity Marital Status  Christina Jacobson [place], [place] [address] [phone] [facility]) [phone] (Home) [email or web address] English (Preferred) White / Not Hispanic or Latino Widowed  Patient Contacts  Contact Name Contact Address Communication Relationship to Patient  [place] Rao [address] [place], [place] [address] [phone] [facility]) [phone] (Work) [phone] (Home) Other, Emergency Contact  Document Information  Primary Care Provider Other Service Providers Document Coverage Dates  Armando Reichert, MD (Dec. 17, 2018December 17, 2018 - Present) [phone] (Work) [phone] (Fax) [facility] [place], [place] [address] Family Medicine    Jun. 30, 2021June 30, 2021 - Jul. 01, 2021July 01, 2021   [place] [address] [place], [place] [address]   Encounter Providers Encounter Date  [facility] Leeanne Deed, MD (Attending) [phone] (Work) [phone] (Fax) [facility] [place], [place] [address] Neurology Jun. 30, 2021June 30, 2021 - Jul. 01, 2021July 01, 2021

## 2019-12-03 NOTE — Op Note (Signed)
NAMEBRISEYDA, Christina Jacobson MEDICAL RECORD [id number] ACCOUNT [id number] DATE OF BIRTH:[birth year] FACILITY: [facility] LOCATION: [facility] PHYSICIAN:Kitty Cadavid Josefine Class, MD  OPERATIVE REPORT  DATE OF PROCEDURE:  12/03/2019  PREOPERATIVE DIAGNOSIS:  Menorrhagia.  POSTOPERATIVE DIAGNOSIS:  Menorrhagia.  PROCEDURE: 1.  Total vaginal hysterectomy. 2.  Left oophorectomy. 3.  Bilateral salpingectomy.  SURGEON:  Laverta [place], MD  FIRST ASSISTANT:  Benjaman Kindler, MD   ANESTHESIA:  General endotracheal anesthesia.  INDICATIONS:  A 39 year old gravida 38, para 3 patient with a long history of significant menorrhagia, passing large clots the size of baseballs.  The patient has failed conservative treatment.  The patient has a remote history of cervical dysplasia.  DESCRIPTION OF PROCEDURE:  After adequate general endotracheal anesthesia, the patient was placed in dorsal supine position, legs placed in the candy cane stirrups.  The patient received 2 g IV Ancef for surgical prophylaxis.  The patient's abdomen,  perineum and vagina were prepped and draped in normal sterile fashion.  Timeout was performed.  A weighted speculum was placed in the posterior vaginal vault and the cervix was grasped with 2 thyroid tenacula.  Cervix was circumferentially injected with  1% lidocaine with 1:100,000 epinephrine.  A direct posterior colpotomy incision was made.  Upon entry into the posterior cul-de-sac, uterosacral ligaments were bilaterally clamped, transected, suture ligated with 0 Vicryl suture and tagged for later  identification.  Anterior cervix was incised with the Bovie.  The cardinal ligaments were then bilaterally clamped, transected, suture ligated with 0 Vicryl suture.  The anterior cul-de-sac was then entered and the Deaver retractor was placed within to  elevate the bladder anteriorly.  The uterine arteries were then bilaterally clamped, transected, suture ligated with 0 Vicryl suture.   Sequential clamping and suturing through the broad ligament ensued.  Ultimately, the cornua were bilaterally clamped  and the uterus was delivered.  Each pedicle was doubly ligated.  The left ovary with the infundibulopelvic ligament had some persistent bleeding that could not be controlled with conservative suturing.  Therefore, the infundibulopelvic ligament was  clamped with a Heaney clamp and the left ovary was removed.  This pedicle was doubly ligated with 0 Vicryl suture.  Difficulty finding the left fallopian tube.  However, a small remnant that was consistent with the fimbriated end was clamped and removed  and will be sent to pathology for identification.  The right fallopian tube was clearly evident and clamped and distal portion removed and doubly ligated with 0 Vicryl suture.  The vaginal cuff appeared hemostatic at this point and the peritoneum was  then closed with a 2-0 PDS suture in a pursestring fashion.  The vaginal cuff was then closed with a running 0 Vicryl suture and the uterosacral ligaments were plicated centrally and the rest of vaginal cuff was closed.  Given the amount of suturing to  the left infundibulopelvic ligament area, a cystoscopy was performed at the end of the case.  The bladder was filled with 250 mL of normal saline and the ureteral orifices were bilaterally identified and normal pluming of dark urine was seen through each  of these sites.  There was no suture noted within the bladder and the cystoscope was removed.  COMPLICATIONS:  There were no complications.  DISPOSITION:  The patient tolerated the procedure well.  ESTIMATED BLOOD LOSS:  100 mL.  INTRAOPERATIVE FLUIDS:  1000 mL.  URINE OUTPUT:  125 mL.  Of note, the patient did have catheterization prior to cystoscopy that revealed 50 mL of dark urine.  VN/NUANCE  D:12/03/2019 T:12/03/2019 JOB:[id number]

## 2019-12-04 ENCOUNTER — Encounter: Payer: Self-pay | Admitting: Obstetrics and Gynecology

## 2019-12-07 LAB — SURGICAL PATHOLOGY

## 2020-04-25 ENCOUNTER — Other Ambulatory Visit: Payer: Self-pay

## 2020-04-25 ENCOUNTER — Encounter: Payer: Self-pay | Admitting: Emergency Medicine

## 2020-04-25 ENCOUNTER — Ambulatory Visit
Admission: EM | Admit: 2020-04-25 | Discharge: 2020-04-25 | Disposition: A | Discharge status: Home / Self Care | Payer: Medicaid Other | Attending: Family Medicine | Admitting: Family Medicine

## 2020-04-25 DIAGNOSIS — R220 Localized swelling, mass and lump, head: Secondary | ICD-10-CM | POA: Diagnosis not present

## 2020-04-25 DIAGNOSIS — K029 Dental caries, unspecified: Secondary | ICD-10-CM | POA: Diagnosis not present

## 2020-04-25 MED ORDER — CLINDAMYCIN HCL 150 MG PO CAPS
450.0000 mg | ORAL_CAPSULE | Freq: Three times a day (TID) | ORAL | 0 refills | Status: AC
Start: 2020-04-25 — End: 2020-05-02

## 2020-04-25 NOTE — Discharge Instructions (Signed)
Medication as prescribed.  See Dentist.  Take care  Dr. Greydis Stlouis 
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
## 2020-04-25 NOTE — ED Triage Notes (Signed)
Patient c/o right side jaw pain and swelling that started yesterday. She also c/o right ear pain.

## 2020-06-20 ENCOUNTER — Other Ambulatory Visit: Payer: Self-pay

## 2020-06-20 ENCOUNTER — Encounter: Payer: Self-pay | Admitting: Emergency Medicine

## 2020-06-20 ENCOUNTER — Ambulatory Visit
Admission: EM | Admit: 2020-06-20 | Discharge: 2020-06-20 | Disposition: A | Discharge status: Home / Self Care | Payer: Medicaid Other | Attending: Family Medicine | Admitting: Family Medicine

## 2020-06-20 DIAGNOSIS — K047 Periapical abscess without sinus: Secondary | ICD-10-CM

## 2020-06-20 MED ORDER — CLINDAMYCIN HCL 150 MG PO CAPS
450.0000 mg | ORAL_CAPSULE | Freq: Three times a day (TID) | ORAL | 0 refills | Status: AC
Start: 2020-06-20 — End: 2020-06-27

## 2020-06-20 NOTE — ED Triage Notes (Signed)
Pt presents today with c/o of swelling to right upper sinus area from tooth x 4 days.
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
## 2020-06-20 NOTE — Discharge Instructions (Signed)
Antibiotic as prescribed.  Please see a dentist.  Take care  Dr. Lacinda Axon

## 2020-06-28 ENCOUNTER — Encounter: Payer: Self-pay | Admitting: Emergency Medicine

## 2020-06-28 ENCOUNTER — Other Ambulatory Visit: Payer: Self-pay

## 2020-06-28 ENCOUNTER — Emergency Department
Admission: EM | Admit: 2020-06-28 | Discharge: 2020-06-28 | Disposition: A | Discharge status: Home / Self Care | Payer: Medicaid Other | Attending: Emergency Medicine | Admitting: Emergency Medicine

## 2020-06-28 DIAGNOSIS — R42 Dizziness and giddiness: Secondary | ICD-10-CM | POA: Insufficient documentation

## 2020-06-28 DIAGNOSIS — I10 Essential (primary) hypertension: Secondary | ICD-10-CM | POA: Diagnosis not present

## 2020-06-28 DIAGNOSIS — T50995A Adverse effect of other drugs, medicaments and biological substances, initial encounter: Secondary | ICD-10-CM | POA: Insufficient documentation

## 2020-06-28 DIAGNOSIS — T50905A Adverse effect of unspecified drugs, medicaments and biological substances, initial encounter: Secondary | ICD-10-CM

## 2020-06-28 DIAGNOSIS — F17210 Nicotine dependence, cigarettes, uncomplicated: Secondary | ICD-10-CM | POA: Diagnosis not present

## 2020-06-28 DIAGNOSIS — R739 Hyperglycemia, unspecified: Secondary | ICD-10-CM | POA: Diagnosis not present

## 2020-06-28 DIAGNOSIS — Z859 Personal history of malignant neoplasm, unspecified: Secondary | ICD-10-CM | POA: Diagnosis not present

## 2020-06-28 LAB — CBC WITH DIFFERENTIAL/PLATELET
Abs Immature Granulocytes: 0.05 K/uL (ref 0.00–0.07)
Basophils Absolute: 0.1 K/uL (ref 0.0–0.1)
Basophils Relative: 1 %
Eosinophils Absolute: 0.6 K/uL — ABNORMAL HIGH (ref 0.0–0.5)
Eosinophils Relative: 5 %
HCT: 44.5 % (ref 36.0–46.0)
Hemoglobin: 14.9 g/dL (ref 12.0–15.0)
Immature Granulocytes: 0 %
Lymphocytes Relative: 23 %
Lymphs Abs: 2.8 K/uL (ref 0.7–4.0)
MCH: 29.7 pg (ref 26.0–34.0)
MCHC: 33.5 g/dL (ref 30.0–36.0)
MCV: 88.8 fL (ref 80.0–100.0)
Monocytes Absolute: 0.5 K/uL (ref 0.1–1.0)
Monocytes Relative: 4 %
Neutro Abs: 8.4 K/uL — ABNORMAL HIGH (ref 1.7–7.7)
Neutrophils Relative %: 67 %
Platelets: 277 K/uL (ref 150–400)
RBC: 5.01 MIL/uL (ref 3.87–5.11)
RDW: 12.9 % (ref 11.5–15.5)
WBC: 12.5 K/uL — ABNORMAL HIGH (ref 4.0–10.5)
nRBC: 0 % (ref 0.0–0.2)

## 2020-06-28 LAB — BASIC METABOLIC PANEL WITH GFR
Anion gap: 12 (ref 5–15)
BUN: <5 mg/dL — ABNORMAL LOW (ref 6–20)
CO2: 24 mmol/L (ref 22–32)
Calcium: 9.1 mg/dL (ref 8.9–10.3)
Chloride: 101 mmol/L (ref 98–111)
Creatinine, Ser: 0.66 mg/dL (ref 0.44–1.00)
GFR, Estimated: >60 mL/min
Glucose, Bld: 158 mg/dL — ABNORMAL HIGH (ref 70–99)
Potassium: 4 mmol/L (ref 3.5–5.1)
Sodium: 137 mmol/L (ref 135–145)

## 2020-06-28 LAB — VALPROIC ACID LEVEL: Valproic Acid Lvl: <10 ug/mL — ABNORMAL LOW (ref 50.0–100.0)

## 2020-06-28 MED ORDER — NORTRIPTYLINE HCL 10 MG PO CAPS
10.0000 mg | ORAL_CAPSULE | Freq: Two times a day (BID) | ORAL | 0 refills | Status: DC
Start: 2020-06-28 — End: 2020-11-15

## 2020-06-28 MED ORDER — NORTRIPTYLINE HCL 10 MG PO CAPS
10.0000 mg | ORAL_CAPSULE | Freq: Two times a day (BID) | ORAL | 0 refills | Status: DC
Start: 2020-06-28 — End: 2020-06-28

## 2020-06-28 NOTE — Discharge Instructions (Addendum)
For your dizziness:  Drink plenty of fluids, ideally 6-8 glasses of water daily  FOR YOUR NORTRIPTYLINE (PAMELOR) -- STOP taking the 25 mg capsules, as these could be contributing to your dizziness -- START taking the 10 mg capsules at night. If you need it, you can take 10 mg twice a day. -- This LOWER dose should help with your dizziness -- HIDE/DISCARD your 25 mg capsules
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
## 2020-06-28 NOTE — ED Notes (Signed)
Pt presents to ED with c/o of feeling dizzy for the past year but states over the past 2 weeks it has be come more persistent. Pt states a HX of seizures and states compliance with anti seizure medications. Pt states taking nortriptyline for the past 6 months but states recently taking delta 8 THC to help with seizures. Pt states occasional vomiting with dizziness and states the feeling of "room spinning". Pt is A&Ox4. Pt denies being pregnant due to hysterectomy. Son bedside. VSS.

## 2020-06-28 NOTE — ED Notes (Signed)
Orthostatic BP's obtained, see flowsheets, pt not orthostatic at this time although pt states dizziness with position changes, MD aware.

## 2020-06-28 NOTE — ED Triage Notes (Signed)
Pt to ED via POV with c/o "dizzy spells", pt states then she feels like she is going to pass out, gets nauseated, and then feels really tired after. Pt states symptoms intermittent and ongoing x 1 year but have recently worsened. Pt states hx of epilepsy. Pt states has a neurologist but hasn't seen him since surgery in July for hysterectomy. Pt arrives A&O x4 and in NAD.   Pt states currently takes nortryptilene and gabapentin for chronic pain and depakote for her seizures.

## 2020-10-31 ENCOUNTER — Encounter: Payer: Self-pay | Admitting: *Deleted

## 2020-10-31 ENCOUNTER — Other Ambulatory Visit: Payer: Self-pay

## 2020-10-31 ENCOUNTER — Emergency Department: Payer: Medicaid Other

## 2020-10-31 ENCOUNTER — Observation Stay
Admission: EM | Admit: 2020-10-31 | Discharge: 2020-11-01 | Disposition: A | Discharge status: Home / Self Care | Payer: Medicaid Other | Attending: Internal Medicine | Admitting: Internal Medicine

## 2020-10-31 DIAGNOSIS — R109 Unspecified abdominal pain: Secondary | ICD-10-CM | POA: Diagnosis present

## 2020-10-31 DIAGNOSIS — Z79899 Other long term (current) drug therapy: Secondary | ICD-10-CM | POA: Diagnosis not present

## 2020-10-31 DIAGNOSIS — K8050 Calculus of bile duct without cholangitis or cholecystitis without obstruction: Principal | ICD-10-CM | POA: Insufficient documentation

## 2020-10-31 DIAGNOSIS — K8071 Calculus of gallbladder and bile duct without cholecystitis with obstruction: Secondary | ICD-10-CM

## 2020-10-31 DIAGNOSIS — R933 Abnormal findings on diagnostic imaging of other parts of digestive tract: Secondary | ICD-10-CM

## 2020-10-31 DIAGNOSIS — F17210 Nicotine dependence, cigarettes, uncomplicated: Secondary | ICD-10-CM | POA: Diagnosis not present

## 2020-10-31 DIAGNOSIS — Z20822 Contact with and (suspected) exposure to covid-19: Secondary | ICD-10-CM | POA: Diagnosis not present

## 2020-10-31 DIAGNOSIS — F319 Bipolar disorder, unspecified: Secondary | ICD-10-CM | POA: Diagnosis not present

## 2020-10-31 DIAGNOSIS — R52 Pain, unspecified: Secondary | ICD-10-CM

## 2020-10-31 LAB — CBC
HCT: 41.7 % (ref 36.0–46.0)
Hemoglobin: 14.3 g/dL (ref 12.0–15.0)
MCH: 30.5 pg (ref 26.0–34.0)
MCHC: 34.3 g/dL (ref 30.0–36.0)
MCV: 88.9 fL (ref 80.0–100.0)
Platelets: 252 10*3/uL (ref 150–400)
RBC: 4.69 MIL/uL (ref 3.87–5.11)
RDW: 13.5 % (ref 11.5–15.5)
WBC: 10.3 10*3/uL (ref 4.0–10.5)
nRBC: 0 % (ref 0.0–0.2)

## 2020-10-31 LAB — URINALYSIS, COMPLETE (UACMP) WITH MICROSCOPIC
Glucose, UA: NEGATIVE mg/dL
Hgb urine dipstick: NEGATIVE
Ketones, ur: NEGATIVE mg/dL
Leukocytes,Ua: NEGATIVE
Nitrite: NEGATIVE
Protein, ur: NEGATIVE mg/dL
Specific Gravity, Urine: 1.017 (ref 1.005–1.030)
pH: 7 (ref 5.0–8.0)

## 2020-10-31 LAB — COMPREHENSIVE METABOLIC PANEL
ALT: 368 U/L — ABNORMAL HIGH (ref 0–44)
AST: 294 U/L — ABNORMAL HIGH (ref 15–41)
Albumin: 4.3 g/dL (ref 3.5–5.0)
Alkaline Phosphatase: 171 U/L — ABNORMAL HIGH (ref 38–126)
Anion gap: 10 (ref 5–15)
BUN: 6 mg/dL (ref 6–20)
CO2: 23 mmol/L (ref 22–32)
Calcium: 9.4 mg/dL (ref 8.9–10.3)
Chloride: 102 mmol/L (ref 98–111)
Creatinine, Ser: 0.62 mg/dL (ref 0.44–1.00)
GFR, Estimated: >60 mL/min (ref >60)
Glucose, Bld: 171 mg/dL — ABNORMAL HIGH (ref 70–99)
Potassium: 3.7 mmol/L (ref 3.5–5.1)
Sodium: 135 mmol/L (ref 135–145)
Total Bilirubin: 2.9 mg/dL — ABNORMAL HIGH (ref 0.3–1.2)
Total Protein: 7.7 g/dL (ref 6.5–8.1)

## 2020-10-31 LAB — POC URINE PREG, ED: Preg Test, Ur: NEGATIVE

## 2020-10-31 LAB — LIPASE, BLOOD: Lipase: 42 U/L (ref 11–51)

## 2020-10-31 MED ORDER — FAMOTIDINE IN NACL 20-0.9 MG/50ML-% IV SOLN
20.0000 mg | Freq: Once | INTRAVENOUS | Status: AC
  Administered 2020-11-01: 20 mg via INTRAVENOUS
  Filled 2020-10-31: qty 50

## 2020-10-31 MED ORDER — SODIUM CHLORIDE 0.9 % IV BOLUS
1000.0000 mL | Freq: Once | INTRAVENOUS | Status: AC
  Administered 2020-11-01: 1000 mL via INTRAVENOUS

## 2020-10-31 MED ORDER — HYDROMORPHONE HCL 1 MG/ML IJ SOLN
0.5000 mg | Freq: Once | INTRAMUSCULAR | Status: AC
Start: 2020-10-31 — End: 2020-11-01
  Administered 2020-11-01: 0.5 mg via INTRAVENOUS
  Filled 2020-10-31: qty 1

## 2020-10-31 MED ORDER — ONDANSETRON HCL 4 MG/2ML IJ SOLN
4.0000 mg | Freq: Once | INTRAMUSCULAR | Status: AC
  Administered 2020-11-01: 4 mg via INTRAVENOUS
  Filled 2020-10-31: qty 2

## 2020-10-31 NOTE — ED Notes (Signed)
Pt unable to void at this time. 

## 2020-10-31 NOTE — ED Triage Notes (Signed)
Pt to triage via wheelchair.  Pt reports upper abd pain with v/d for 3 days.  Taking miralax without relief. Hx ibs.  Pt alert  Speech clear.

## 2020-10-31 NOTE — ED Notes (Signed)
Reviewed pt's lab results and spoke with Dr Archie Balboa regarding....ultrasound ordered as requested and pt upated on plan of care
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
## 2020-11-01 ENCOUNTER — Emergency Department: Payer: Medicaid Other

## 2020-11-01 DIAGNOSIS — K8050 Calculus of bile duct without cholangitis or cholecystitis without obstruction: Secondary | ICD-10-CM | POA: Diagnosis present

## 2020-11-01 LAB — RESP PANEL BY RT-PCR (FLU A&B, COVID) ARPGX2
Influenza A by PCR: NEGATIVE
Influenza B by PCR: NEGATIVE
SARS Coronavirus 2 by RT PCR: NEGATIVE

## 2020-11-01 MED ORDER — LORAZEPAM 2 MG/ML IJ SOLN
0.5000 mg | Freq: Once | INTRAMUSCULAR | Status: AC
  Administered 2020-11-01: 0.5 mg via INTRAVENOUS
  Filled 2020-11-01: qty 1

## 2020-11-01 NOTE — Progress Notes (Signed)
Initial request for admission due to concerns for possible choledocholithiasis and ?? need for ERCP. Patient presents for evaluation of a 3-day history of abdominal pain mostly in the epigastrium and periumbilical area associated with multiple episodes of nausea and vomiting. Labs showed transaminitis, slightly elevated bilirubin level and imaging was suggestive of a dilated common bile duct at 16 mm without evidence of acute cholecystitis. Patient was seen and examined at bedside and appeared comfortable and in no distress.  Abdominal pain has resolved and she denies having any more nausea. On palpation there was no focal tenderness. MRCP showed cholelithiasis without evidence for gallbladder wall thickening or pericholecystic fluid. Intra and extrahepatic biliary duct dilatation is decreased in the interval since yesterday's ultrasound exam. Common bile duct measures 6 mm diameter in the head of the pancreas. No evidence for choledocholithiasis. Hepatomegaly with hepatic steatosis. Discussed with Dr Vladimir Crofts.  Since patient has no evidence of choledocholithiasis and symptoms have improved she could probably be discharged home to follow-up with GI as an outpatient.  We are both in agreement.

## 2020-11-01 NOTE — Discharge Instructions (Addendum)
As we discussed, you still have gallstones in your gallbladder and worsening/severe pain may happen again.  Please follow-up with the surgeon in the clinic to discuss having her gallbladder removed electively.  If you develop any additional episodes of severe/uncontrolled pain, fevers with your pain, please return to the ED.

## 2020-11-01 NOTE — ED Notes (Signed)
Patient to MRI via wheelchair.

## 2020-11-01 NOTE — ED Provider Notes (Signed)
Clinical Course as of 11/01/20 0934  Wed Nov 01, 2020  0106 Updated patient on ultrasound results.  Will proceed with MRCP.  Patient will require low-dose calming agent for MRCP. [JS]  0609 Delay due to MRCP interpretation.  I had discussed case with hospitalist services earlier and we were awaiting MRCP result.  I am told MRCP will not be read by radiology until after shift change.  Have called hospitalist services back who will admit.  Patient currently resting. [JS]  [id number] I received a phone call from Dr. Francine Graven, morning hospitalist who was paged to admit the patient.  She expresses concern that patient would not need admission due to MRCP finally being read and without evidence of choledocholithiasis.  She has evaluated the patient, who indicates resolution of her abdominal pain, improvement in nausea and is tolerating p.o. intake.  We discussed the possibility of recently passed CBD stone causing her elevated LFTs and bilirubin.  We discussed no current indications for admission considering reassuring MRCP and patient would be reasonable for outpatient management with GI follow-up.  I believe this is reasonable.  She reports that she will put a small note in and request that I discharge the patient.  I will reevaluate the patient and plan to discharge. [DS]  5852 I reevaluate the patient and educate her of this change of plans.  She reports that she does feel better and that her serious pain has resolved.  She reports that she "always" has some epigastric abdominal pain and is currently at its baseline.  She has tolerated p.o. intake.  We discussed the possibility of recently passed CBD stone, but persistent biliary stones and the possibility of recurrence of this episode.  We discussed following up with surgery as an outpatient to discuss cholecystectomy.  We discussed return precautions for the ED and patient reports understanding and agreement. [DS]    Clinical Course User Index [DS] Vladimir Crofts,  MD [JS] Paulette Blanch, MD      Vladimir Crofts, MD 11/01/20 [phone]

## 2020-11-07 ENCOUNTER — Ambulatory Visit: Payer: Self-pay | Admitting: General Surgery

## 2020-11-07 NOTE — H&P (Signed)
PATIENT PROFILE: Christina Jacobson is a 40 y.o. female who presents to the Clinic for consultation at the request of Christina Jacobson for evaluation of cholelithiasis.  PCP:  None  HISTORY OF PRESENT ILLNESS: Christina Jacobson reports she has been having intermittent abdominal pain since a year ago.  Recently she went to the emergency room due to this pain.  Pain localized to the epigastric area.  The pain radiates to the right upper quadrant.  The pain exacerbated by eating.  There has been no alleviating factors.  At the emergency room she had labs that showed elevated liver enzymes.  She had MRCP that shows no sign of choledocholithiasis.  The pain resolved and she was discharged.  She also had ultrasound of the abdomen that shows stone in the gallbladder neck.  I personally evaluated the images.  Of note the patient reported that she was told that she discontinued the Depakote due to elevated liver enzymes.   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       GENERAL REVIEW OF SYSTEMS:   General ROS: negative for - chills, fatigue, fever, weight gain or weight loss Allergy and Immunology ROS: negative for - hives  Hematological and Lymphatic ROS: negative for - bleeding problems or bruising, negative for palpable nodes Endocrine ROS: negative for - heat or cold intolerance, hair changes Respiratory ROS: negative for - cough, shortness of breath or wheezing Cardiovascular ROS: no chest pain or palpitations GI ROS: negative for nausea, vomiting, positive for abdominal pain, constipation Musculoskeletal ROS: negative for - joint swelling or muscle pain Neurological ROS: negative for - confusion, syncope Dermatological ROS: negative for pruritus and rash Psychiatric: negative for anxiety, depression, difficulty sleeping and memory loss  MEDICATIONS: Current Outpatient  Medications  Medication Sig Dispense Refill   cranberry 400 mg Cap Take by mouth     escitalopram oxalate (LEXAPRO) 10 MG tablet Take 10 mg by mouth once daily     gabapentin (NEURONTIN) 300 MG capsule Take 1 capsule (300 mg total) by mouth nightly 30 capsule 11   NON FORMULARY Delta-8 (CBD) --- smoke weekly     nortriptyline (PAMELOR) 50 MG capsule Take 50 mg by mouth nightly     divalproex (DEPAKOTE) 500 MG DR tablet Take by mouth Take 500 mg by mouth 2 (two) times daily (Patient not taking: Reported on 11/07/2020)     HYDROcodone-acetaminophen (NORCO) 5-325 mg tablet Take 1 tablet by mouth every 4 (four) hours as needed for Pain POST OP 1-2 TABLETS EVERY 4 -6 HOURS PRN (Patient not taking: No sig reported) 20 tablet 0   ibuprofen (MOTRIN) 800 MG tablet Take 1 tablet (800 mg total) by mouth every 8 (eight) hours as needed for Pain (Patient not taking: Reported on 11/07/2020) 30 tablet 1   ibuprofen-diphenhydramine cit 200-38 mg Tab Take by mouth Take 1 tablet by mouth at bedtime (Patient not taking: Reported on 11/07/2020)     omeprazole (PRILOSEC) 20 MG DR capsule TAKE ONE CAPSULE BY MOUTH DAILY FOR REFLUX (Patient not taking: Reported on 11/07/2020)  2   ondansetron (ZOFRAN) 8 MG tablet Take 1 tablet (8 mg total) by mouth every 8 (eight) hours as needed for Nausea (Patient not taking: Reported on 11/07/2020) 30 tablet 0   No current facility-administered medications for this visit.    ALLERGIES: Doxycycline, Amoxicillin, and  Penicillins  PAST MEDICAL HISTORY: Past Medical History:  Diagnosis Date   Arthritis    Depression    Dysmenorrhea    IBS (irritable bowel syndrome) 2017   Migraine headache    Seizures (CMS-HCC)    Sleep apnea     PAST SURGICAL HISTORY: Past Surgical History:  Procedure Laterality Date   HYSTERECTOMY  12/03/2019   [facility] L oophorectomy bilateral salpingectomy   TUBAL LIGATION     [facility] left oophorectomy bilateral salpingectomy  12/03/2019     FAMILY  HISTORY: Family History  Problem Relation Age of Onset   Developmental delay Son    Cancer Maternal Grandfather    Diabetes Paternal Grandmother      SOCIAL HISTORY: Social History   Socioeconomic History   Marital status: Widowed  Tobacco Use   Smoking status: Current Every Day Smoker    Packs/day: 0.50   Smokeless tobacco: Never Used  Vaping Use   Vaping Use: Never used  Substance and Sexual Activity   Alcohol use: No   Drug use: No   Sexual activity: Yes    Partners: Male    Birth control/protection: Surgical    Comment: BTL 2012  Social History Narrative   Education: Secretary/administrator   Occupation: Homemaker   Hobbies: art          PHYSICAL EXAM: Vitals:   11/07/20 1423  BP: (!) 147/96  Pulse: 89   Body mass index is 30.82 kg/m. Weight: 100.2 kg (221 lb)   GENERAL: Alert, active, oriented x3  HEENT: Pupils equal reactive to light. Extraocular movements are intact. Sclera clear. Palpebral conjunctiva normal red color.Pharynx clear.  NECK: Supple with no palpable mass and no adenopathy.  LUNGS: Sound clear with no rales rhonchi or wheezes.  HEART: Regular rhythm S1 and S2 without murmur.  ABDOMEN: Soft and depressible, nontender with no palpable mass, no hepatomegaly.   EXTREMITIES: Well-developed well-nourished symmetrical with no dependent edema.  NEUROLOGICAL: Awake alert oriented, facial expression symmetrical, moving all extremities.  REVIEW OF DATA: I have reviewed the following data today: No visits with results within 3 Month(s) from this visit.  Latest known visit with results is:  Orders Only on 12/21/2019  Component Date Value   Specific Gravity - [facility] 12/21/2019 1.025    pH - [facility] 12/21/2019 5.5    Color - [facility] 12/21/2019 Yellow    Appearance - [facility] 12/21/2019 Cloudy (!)   WBC Esterase - [facility] 12/21/2019 2+ (!)   Protein   - [facility] 12/21/2019 Trace    Glucose UA - [facility] 12/21/2019 Negative    Ketones - [facility] 12/21/2019  Negative    Occult Blood - [facility] 12/21/2019 1+ (!)   Bilirubin   - [facility] 12/21/2019 Negative    Urobilinogen, Semi-Qn - * 12/21/2019 0.2    Nitrite, Urine - [facility] 12/21/2019 Negative    Microscopic Examination * 12/21/2019 See below:    WBC - [facility] 12/21/2019 >30 (!)   RBC - [facility] 12/21/2019 3-10 (!)   Epithelial Cells (Non Re* 12/21/2019 0-10    Casts - [facility] 12/21/2019 None seen    Crystals - [facility] 12/21/2019 Present (!)   Crystal Type - [facility] 12/21/2019 Calcium Oxalate    Bacteria - [facility] 12/21/2019 Moderate (!)   Urine Culture, Routine -* 12/21/2019 Final report    Result 1 - [facility] 12/21/2019 Comment      ASSESSMENT: Ms. Kelty is a 40 y.o. female presenting for consultation for cholelithiasis.    Patient was oriented about the  diagnosis of cholelithiasis. Also oriented about what is the gallbladder, its anatomy and function and the implications of having stones / gallbladder low ejection fraction. The patient was oriented about the treatment alternatives (observation vs cholecystectomy). Patient was oriented that a low percentage of patient will continue to have similar pain symptoms even after the gallbladder is removed. Surgical technique (open vs laparoscopic) was discussed. It was also discussed the goals of the surgery (decrease the pain episodes and avoid the risk of cholecystitis) and the risk of surgery including: bleeding, infection, common bile duct injury, stone retention, injury to other organs such as bowel, liver, stomach, other complications such as hernia, bowel obstruction among others. Also discussed with patient about anesthesia and its complications such as: reaction to medications, pneumonia, heart complications, death, among others.   Due to her history of seizures, will get neurology clearance.  We will get a palpation panel to make decision regarding possible cholangiogram.  Cholelithiasis without cholecystitis [K80.20]  PLAN: 1.  Robotic  assisted laparoscopic cholecystectomy with possible cholangiogram (22411) 2.  Hepatic function panel 3.  Neurology clearance 4.  Do not take aspirin 5 days before the procedure 5.  Contact us if has any question or concern.   Patient verbalized understanding, all questions were answered, and were agreeable with the plan outlined above.     Herbert Pun, MD  Electronically signed by Herbert Pun, MD

## 2020-11-07 NOTE — H&P (View-Only) (Signed)
PATIENT PROFILE: Christina Jacobson is a 40 y.o. female who presents to the Clinic for consultation at the request of Dr. Francine Graven for evaluation of cholelithiasis.  PCP:  None  HISTORY OF PRESENT ILLNESS: Christina Jacobson reports she has been having intermittent abdominal pain since a year ago.  Recently she went to the emergency room due to this pain.  Pain localized to the epigastric area.  The pain radiates to the right upper quadrant.  The pain exacerbated by eating.  There has been no alleviating factors.  At the emergency room she had labs that showed elevated liver enzymes.  She had MRCP that shows no sign of choledocholithiasis.  The pain resolved and she was discharged.  She also had ultrasound of the abdomen that shows stone in the gallbladder neck.  I personally evaluated the images.  Of note the patient reported that she was told that she discontinued the Depakote due to elevated liver enzymes.   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       GENERAL REVIEW OF SYSTEMS:   General ROS: negative for - chills, fatigue, fever, weight gain or weight loss Allergy and Immunology ROS: negative for - hives  Hematological and Lymphatic ROS: negative for - bleeding problems or bruising, negative for palpable nodes Endocrine ROS: negative for - heat or cold intolerance, hair changes Respiratory ROS: negative for - cough, shortness of breath or wheezing Cardiovascular ROS: no chest pain or palpitations GI ROS: negative for nausea, vomiting, positive for abdominal pain, constipation Musculoskeletal ROS: negative for - joint swelling or muscle pain Neurological ROS: negative for - confusion, syncope Dermatological ROS: negative for pruritus and rash Psychiatric: negative for anxiety, depression, difficulty sleeping and memory loss  MEDICATIONS: Current Outpatient  Medications  Medication Sig Dispense Refill   cranberry 400 mg Cap Take by mouth     escitalopram oxalate (LEXAPRO) 10 MG tablet Take 10 mg by mouth once daily     gabapentin (NEURONTIN) 300 MG capsule Take 1 capsule (300 mg total) by mouth nightly 30 capsule 11   NON FORMULARY Delta-8 (CBD) --- smoke weekly     nortriptyline (PAMELOR) 50 MG capsule Take 50 mg by mouth nightly     divalproex (DEPAKOTE) 500 MG DR tablet Take by mouth Take 500 mg by mouth 2 (two) times daily (Patient not taking: Reported on 11/07/2020)     HYDROcodone-acetaminophen (NORCO) 5-325 mg tablet Take 1 tablet by mouth every 4 (four) hours as needed for Pain POST OP 1-2 TABLETS EVERY 4 -6 HOURS PRN (Patient not taking: No sig reported) 20 tablet 0   ibuprofen (MOTRIN) 800 MG tablet Take 1 tablet (800 mg total) by mouth every 8 (eight) hours as needed for Pain (Patient not taking: Reported on 11/07/2020) 30 tablet 1   ibuprofen-diphenhydramine cit 200-38 mg Tab Take by mouth Take 1 tablet by mouth at bedtime (Patient not taking: Reported on 11/07/2020)     omeprazole (PRILOSEC) 20 MG DR capsule TAKE ONE CAPSULE BY MOUTH DAILY FOR REFLUX (Patient not taking: Reported on 11/07/2020)  2   ondansetron (ZOFRAN) 8 MG tablet Take 1 tablet (8 mg total) by mouth every 8 (eight) hours as needed for Nausea (Patient not taking: Reported on 11/07/2020) 30 tablet 0   No current facility-administered medications for this visit.    ALLERGIES: Doxycycline, Amoxicillin, and  Penicillins  PAST MEDICAL HISTORY: Past Medical History:  Diagnosis Date   Arthritis    Depression    Dysmenorrhea    IBS (irritable bowel syndrome) 2017   Migraine headache    Seizures (CMS-HCC)    Sleep apnea     PAST SURGICAL HISTORY: Past Surgical History:  Procedure Laterality Date   HYSTERECTOMY  12/03/2019   [facility] L oophorectomy bilateral salpingectomy   TUBAL LIGATION     [facility] left oophorectomy bilateral salpingectomy  12/03/2019     FAMILY  HISTORY: Family History  Problem Relation Age of Onset   Developmental delay Son    Cancer Maternal Grandfather    Diabetes Paternal Grandmother      SOCIAL HISTORY: Social History   Socioeconomic History   Marital status: Widowed  Tobacco Use   Smoking status: Current Every Day Smoker    Packs/day: 0.50   Smokeless tobacco: Never Used  Vaping Use   Vaping Use: Never used  Substance and Sexual Activity   Alcohol use: No   Drug use: No   Sexual activity: Yes    Partners: Male    Birth control/protection: Surgical    Comment: BTL 2012  Social History Narrative   Education: Secretary/administrator   Occupation: Homemaker   Hobbies: art          PHYSICAL EXAM: Vitals:   11/07/20 1423  BP: (!) 147/96  Pulse: 89   Body mass index is 30.82 kg/m. Weight: 100.2 kg (221 lb)   GENERAL: Alert, active, oriented x3  HEENT: Pupils equal reactive to light. Extraocular movements are intact. Sclera clear. Palpebral conjunctiva normal red color.Pharynx clear.  NECK: Supple with no palpable mass and no adenopathy.  LUNGS: Sound clear with no rales rhonchi or wheezes.  HEART: Regular rhythm S1 and S2 without murmur.  ABDOMEN: Soft and depressible, nontender with no palpable mass, no hepatomegaly.   EXTREMITIES: Well-developed well-nourished symmetrical with no dependent edema.  NEUROLOGICAL: Awake alert oriented, facial expression symmetrical, moving all extremities.  REVIEW OF DATA: I have reviewed the following data today: No visits with results within 3 Month(s) from this visit.  Latest known visit with results is:  Orders Only on 12/21/2019  Component Date Value   Specific Gravity - [facility] 12/21/2019 1.025    pH - [facility] 12/21/2019 5.5    Color - [facility] 12/21/2019 Yellow    Appearance - [facility] 12/21/2019 Cloudy (!)   WBC Esterase - [facility] 12/21/2019 2+ (!)   Protein   - [facility] 12/21/2019 Trace    Glucose UA - [facility] 12/21/2019 Negative    Ketones - [facility] 12/21/2019  Negative    Occult Blood - [facility] 12/21/2019 1+ (!)   Bilirubin   - [facility] 12/21/2019 Negative    Urobilinogen, Semi-Qn - * 12/21/2019 0.2    Nitrite, Urine - [facility] 12/21/2019 Negative    Microscopic Examination * 12/21/2019 See below:    WBC - [facility] 12/21/2019 >30 (!)   RBC - [facility] 12/21/2019 3-10 (!)   Epithelial Cells (Non Re* 12/21/2019 0-10    Casts - [facility] 12/21/2019 None seen    Crystals - [facility] 12/21/2019 Present (!)   Crystal Type - [facility] 12/21/2019 Calcium Oxalate    Bacteria - [facility] 12/21/2019 Moderate (!)   Urine Culture, Routine -* 12/21/2019 Final report    Result 1 - [facility] 12/21/2019 Comment      ASSESSMENT: Christina Jacobson is a 40 y.o. female presenting for consultation for cholelithiasis.    Patient was oriented about the  diagnosis of cholelithiasis. Also oriented about what is the gallbladder, its anatomy and function and the implications of having stones / gallbladder low ejection fraction. The patient was oriented about the treatment alternatives (observation vs cholecystectomy). Patient was oriented that a low percentage of patient will continue to have similar pain symptoms even after the gallbladder is removed. Surgical technique (open vs laparoscopic) was discussed. It was also discussed the goals of the surgery (decrease the pain episodes and avoid the risk of cholecystitis) and the risk of surgery including: bleeding, infection, common bile duct injury, stone retention, injury to other organs such as bowel, liver, stomach, other complications such as hernia, bowel obstruction among others. Also discussed with patient about anesthesia and its complications such as: reaction to medications, pneumonia, heart complications, death, among others.   Due to her history of seizures, will get neurology clearance.  We will get a palpation panel to make decision regarding possible cholangiogram.  Cholelithiasis without cholecystitis [K80.20]  PLAN: 1.  Robotic  assisted laparoscopic cholecystectomy with possible cholangiogram (64314) 2.  Hepatic function panel 3.  Neurology clearance 4.  Do not take aspirin 5 days before the procedure 5.  Contact us if has any question or concern.   Patient verbalized understanding, all questions were answered, and were agreeable with the plan outlined above.     Herbert Pun, MD  Electronically signed by Herbert Pun, MD

## 2020-11-09 ENCOUNTER — Emergency Department: Payer: Medicaid Other

## 2020-11-09 ENCOUNTER — Other Ambulatory Visit: Payer: Self-pay

## 2020-11-09 ENCOUNTER — Emergency Department
Admission: EM | Admit: 2020-11-09 | Discharge: 2020-11-09 | Disposition: A | Discharge status: Home / Self Care | Payer: Medicaid Other | Attending: Emergency Medicine | Admitting: Emergency Medicine

## 2020-11-09 DIAGNOSIS — K8020 Calculus of gallbladder without cholecystitis without obstruction: Secondary | ICD-10-CM | POA: Diagnosis not present

## 2020-11-09 DIAGNOSIS — Z8541 Personal history of malignant neoplasm of cervix uteri: Secondary | ICD-10-CM | POA: Insufficient documentation

## 2020-11-09 DIAGNOSIS — R109 Unspecified abdominal pain: Secondary | ICD-10-CM | POA: Diagnosis present

## 2020-11-09 DIAGNOSIS — K219 Gastro-esophageal reflux disease without esophagitis: Secondary | ICD-10-CM | POA: Diagnosis not present

## 2020-11-09 DIAGNOSIS — F17210 Nicotine dependence, cigarettes, uncomplicated: Secondary | ICD-10-CM | POA: Diagnosis not present

## 2020-11-09 DIAGNOSIS — R1011 Right upper quadrant pain: Secondary | ICD-10-CM

## 2020-11-09 LAB — URINALYSIS, COMPLETE (UACMP) WITH MICROSCOPIC
Bilirubin Urine: NEGATIVE
Glucose, UA: NEGATIVE mg/dL
Ketones, ur: NEGATIVE mg/dL
Leukocytes,Ua: NEGATIVE
Nitrite: NEGATIVE
Protein, ur: NEGATIVE mg/dL
Specific Gravity, Urine: 1.003 — ABNORMAL LOW (ref 1.005–1.030)
pH: 6 (ref 5.0–8.0)

## 2020-11-09 LAB — COMPREHENSIVE METABOLIC PANEL
ALT: 132 U/L — ABNORMAL HIGH (ref 0–44)
AST: 94 U/L — ABNORMAL HIGH (ref 15–41)
Albumin: 4.6 g/dL (ref 3.5–5.0)
Alkaline Phosphatase: 125 U/L (ref 38–126)
Anion gap: 11 (ref 5–15)
BUN: 7 mg/dL (ref 6–20)
CO2: 21 mmol/L — ABNORMAL LOW (ref 22–32)
Calcium: 9.3 mg/dL (ref 8.9–10.3)
Chloride: 101 mmol/L (ref 98–111)
Creatinine, Ser: 0.62 mg/dL (ref 0.44–1.00)
GFR, Estimated: >60 mL/min (ref >60)
Glucose, Bld: 172 mg/dL — ABNORMAL HIGH (ref 70–99)
Potassium: 3.6 mmol/L (ref 3.5–5.1)
Sodium: 133 mmol/L — ABNORMAL LOW (ref 135–145)
Total Bilirubin: 1 mg/dL (ref 0.3–1.2)
Total Protein: 8.2 g/dL — ABNORMAL HIGH (ref 6.5–8.1)

## 2020-11-09 LAB — CBC
HCT: 45.8 % (ref 36.0–46.0)
Hemoglobin: 15.5 g/dL — ABNORMAL HIGH (ref 12.0–15.0)
MCH: 30.3 pg (ref 26.0–34.0)
MCHC: 33.8 g/dL (ref 30.0–36.0)
MCV: 89.5 fL (ref 80.0–100.0)
Platelets: 250 10*3/uL (ref 150–400)
RBC: 5.12 MIL/uL — ABNORMAL HIGH (ref 3.87–5.11)
RDW: 13.1 % (ref 11.5–15.5)
WBC: 10.3 10*3/uL (ref 4.0–10.5)
nRBC: 0 % (ref 0.0–0.2)

## 2020-11-09 LAB — LIPASE, BLOOD: Lipase: 30 U/L (ref 11–51)

## 2020-11-09 MED ORDER — ACETAMINOPHEN 500 MG PO TABS
1000.0000 mg | ORAL_TABLET | Freq: Once | ORAL | Status: AC
  Administered 2020-11-09: 1000 mg via ORAL
  Filled 2020-11-09: qty 2

## 2020-11-09 MED ORDER — ONDANSETRON HCL 4 MG/2ML IJ SOLN
4.0000 mg | Freq: Once | INTRAMUSCULAR | Status: AC
  Administered 2020-11-09: 4 mg via INTRAVENOUS
  Filled 2020-11-09: qty 2

## 2020-11-09 MED ORDER — OXYCODONE HCL 5 MG PO TABS
5.0000 mg | ORAL_TABLET | Freq: Once | ORAL | Status: AC
  Administered 2020-11-09: 5 mg via ORAL
  Filled 2020-11-09: qty 1

## 2020-11-09 MED ORDER — OXYCODONE HCL 5 MG PO TABS: 5.0000 mg | ORAL_TABLET | Freq: Four times a day (QID) | ORAL | 0 refills | Status: DC | PRN

## 2020-11-09 MED ORDER — MORPHINE SULFATE (PF) 4 MG/ML IV SOLN
6.0000 mg | Freq: Once | INTRAVENOUS | Status: AC
Start: 2020-11-09 — End: 2020-11-09
  Administered 2020-11-09: 6 mg via INTRAVENOUS
  Filled 2020-11-09: qty 2

## 2020-11-09 MED ORDER — LACTATED RINGERS IV BOLUS
1000.0000 mL | Freq: Once | INTRAVENOUS | Status: AC
  Administered 2020-11-09: 1000 mL via INTRAVENOUS

## 2020-11-09 MED ORDER — ONDANSETRON 4 MG PO TBDP: 4.0000 mg | ORAL_TABLET | Freq: Three times a day (TID) | ORAL | 0 refills | Status: DC | PRN

## 2020-11-09 NOTE — ED Triage Notes (Signed)
Pt to ED POV for upper abd pain, scheduled to have gallbladder surgery.  +n/v Pt ambulatory

## 2020-11-09 NOTE — Discharge Instructions (Addendum)
Use Tylenol for pain and fevers.  Up to 1000 mg per dose, up to 4 times per day.  Do not take more than 4000 mg of Tylenol/acetaminophen within 24 hours..  Use the oxycodone as needed on top of the Tylenol and normal pain medications you take.  Use Zofran as needed for any further nausea.  Return to the ED with any severely worsening symptoms despite these measures, fevers with your symptoms.
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
## 2020-11-10 ENCOUNTER — Ambulatory Visit: Payer: Self-pay | Admitting: General Surgery

## 2020-11-14 ENCOUNTER — Other Ambulatory Visit: Payer: Self-pay

## 2020-11-14 ENCOUNTER — Encounter
Admission: RE | Admit: 2020-11-14 | Discharge: 2020-11-14 | Disposition: A | Discharge status: Home / Self Care | Payer: Medicaid Other | Source: Ambulatory Visit | Attending: General Surgery | Admitting: General Surgery

## 2020-11-14 MED ORDER — CHLORHEXIDINE GLUCONATE 0.12 % MT SOLN: 15.0000 mL | Freq: Once | OROMUCOSAL | Status: AC

## 2020-11-14 MED ORDER — ORAL CARE MOUTH RINSE: 15.0000 mL | Freq: Once | OROMUCOSAL | Status: AC

## 2020-11-14 MED ORDER — CEFAZOLIN SODIUM-DEXTROSE 2-4 GM/100ML-% IV SOLN
2.0000 g | INTRAVENOUS | Status: AC
  Administered 2020-11-15: 2 g via INTRAVENOUS

## 2020-11-14 MED ORDER — INDOCYANINE GREEN 25 MG IV SOLR
1.2500 mg | Freq: Once | INTRAVENOUS | Status: AC
  Administered 2020-11-15: 1.25 mg via INTRAVENOUS
  Filled 2020-11-14: qty 0.5

## 2020-11-14 MED ORDER — LACTATED RINGERS IV SOLN: INTRAVENOUS | Status: DC

## 2020-11-14 MED ORDER — FAMOTIDINE 20 MG PO TABS: 20.0000 mg | ORAL_TABLET | Freq: Once | ORAL | Status: AC

## 2020-11-14 NOTE — Patient Instructions (Addendum)
Your procedure is scheduled on: Wednesday, June 22 Report to the Registration Desk on the 1st floor of the [facility] at 6 am. To find out your arrival time, please call [phone]   REMEMBER: Instructions that are not followed completely may result in serious medical risk, up to and including death; or upon the discretion of your surgeon and anesthesiologist your surgery may need to be rescheduled.  Do not eat or drink anything after midnight the night before surgery.  No gum chewing, lozengers or hard candies.  DO NOT TAKE ANY MEDICATIONS THE MORNING OF SURGERY  One week prior to surgery: Stop Anti-inflammatories (NSAIDS) such as Advil, Aleve, Ibuprofen, Motrin, Naproxen, Naprosyn and Aspirin based products such as Excedrin, Goodys Powder, BC Powder. Stop ANY OVER THE COUNTER supplements until after surgery. You may however, continue to take Tylenol if needed for pain up until the day of surgery.  No Alcohol for 24 hours before or after surgery.  No Smoking including e-cigarettes for 24 hours prior to surgery.  No chewable tobacco products for at least 6 hours prior to surgery.  No nicotine patches on the day of surgery.  Do not use any "recreational" drugs for at least a week prior to your surgery.  Please be advised that the combination of cocaine and anesthesia may have negative outcomes, up to and including death. If you test positive for cocaine, your surgery will be cancelled.  On the morning of surgery brush your teeth with toothpaste and water, you may rinse your mouth with mouthwash if you wish. Do not swallow any toothpaste or mouthwash.  Do not wear jewelry, make-up, hairpins, clips or nail polish.  Do not wear lotions, powders, or perfumes.   Do not shave body from the neck down 48 hours prior to surgery just in case you cut yourself which could leave a site for infection.   Contact lenses, hearing aids and dentures may not be worn into surgery.  Do not bring  valuables to the hospital. [facility] is not responsible for any missing/lost belongings or valuables.   SHOWER USING ANTIBACTERIAL SOAP PRIOR TO COMING TO HOSPITAL.  Notify your doctor if there is any change in your medical condition (cold, fever, infection).  Wear comfortable clothing (specific to your surgery type) to the hospital.  After surgery, you can help prevent lung complications by doing breathing exercises.  Take deep breaths and cough every 1-2 hours. Your doctor may order a device called an Incentive Spirometer to help you take deep breaths. When coughing or sneezing, hold a pillow firmly against your incision with both hands. This is called "splinting." Doing this helps protect your incision. It also decreases belly discomfort.  If you are being discharged the day of surgery, you will not be allowed to drive home. You will need a responsible adult (18 years or older) to drive you home and stay with you that night.   If you are taking public transportation, you will need to have a responsible adult (18 years or older) with you. Please confirm with your physician that it is acceptable to use public transportation.   Please call the [facility] at [phone] if you have any questions about these instructions.  Surgery Visitation Policy:  Patients undergoing a surgery or procedure may have one family member or support person with them as long as that person is not COVID-19 positive or experiencing its symptoms.  That person may remain in the waiting area during the procedure.

## 2020-11-15 ENCOUNTER — Encounter: Payer: Self-pay | Admitting: General Surgery

## 2020-11-15 ENCOUNTER — Ambulatory Visit: Payer: Medicaid Other | Admitting: Certified Registered"

## 2020-11-15 ENCOUNTER — Encounter
Admission: RE | Disposition: A | Discharge status: Home / Self Care | Payer: Self-pay | Source: Home / Self Care | Attending: General Surgery

## 2020-11-15 ENCOUNTER — Ambulatory Visit
Admission: RE | Admit: 2020-11-15 | Discharge: 2020-11-15 | Disposition: A | Discharge status: Home / Self Care | Payer: Medicaid Other | Attending: General Surgery | Admitting: General Surgery

## 2020-11-15 DIAGNOSIS — N921 Excessive and frequent menstruation with irregular cycle: Secondary | ICD-10-CM | POA: Insufficient documentation

## 2020-11-15 DIAGNOSIS — Z833 Family history of diabetes mellitus: Secondary | ICD-10-CM | POA: Diagnosis not present

## 2020-11-15 DIAGNOSIS — G40919 Epilepsy, unspecified, intractable, without status epilepticus: Secondary | ICD-10-CM | POA: Diagnosis not present

## 2020-11-15 DIAGNOSIS — K828 Other specified diseases of gallbladder: Secondary | ICD-10-CM | POA: Insufficient documentation

## 2020-11-15 DIAGNOSIS — F17200 Nicotine dependence, unspecified, uncomplicated: Secondary | ICD-10-CM | POA: Insufficient documentation

## 2020-11-15 DIAGNOSIS — Z791 Long term (current) use of non-steroidal anti-inflammatories (NSAID): Secondary | ICD-10-CM | POA: Insufficient documentation

## 2020-11-15 DIAGNOSIS — Z880 Allergy status to penicillin: Secondary | ICD-10-CM | POA: Insufficient documentation

## 2020-11-15 DIAGNOSIS — Z881 Allergy status to other antibiotic agents status: Secondary | ICD-10-CM | POA: Diagnosis not present

## 2020-11-15 DIAGNOSIS — K8010 Calculus of gallbladder with chronic cholecystitis without obstruction: Secondary | ICD-10-CM | POA: Insufficient documentation

## 2020-11-15 DIAGNOSIS — Z79899 Other long term (current) drug therapy: Secondary | ICD-10-CM | POA: Insufficient documentation

## 2020-11-15 SURGERY — CHOLECYSTECTOMY, ROBOT-ASSISTED, LAPAROSCOPIC
Anesthesia: General | Site: Abdomen

## 2020-11-15 MED ORDER — ONDANSETRON HCL 4 MG/2ML IJ SOLN
INTRAMUSCULAR | Status: DC | PRN
  Administered 2020-11-15: 4 mg via INTRAVENOUS

## 2020-11-15 MED ORDER — FENTANYL CITRATE (PF) 100 MCG/2ML IJ SOLN
INTRAMUSCULAR | Status: AC
  Administered 2020-11-15: 25 ug via INTRAVENOUS
  Filled 2020-11-15: qty 2

## 2020-11-15 MED ORDER — CEFAZOLIN SODIUM-DEXTROSE 2-4 GM/100ML-% IV SOLN
INTRAVENOUS | Status: AC
  Filled 2020-11-15: qty 100

## 2020-11-15 MED ORDER — FENTANYL CITRATE (PF) 100 MCG/2ML IJ SOLN
INTRAMUSCULAR | Status: DC | PRN
  Administered 2020-11-15 (×2): 50 ug via INTRAVENOUS

## 2020-11-15 MED ORDER — PROMETHAZINE HCL 25 MG/ML IJ SOLN: 6.2500 mg | INTRAMUSCULAR | Status: DC | PRN

## 2020-11-15 MED ORDER — ACETAMINOPHEN 10 MG/ML IV SOLN
INTRAVENOUS | Status: AC
  Filled 2020-11-15: qty 100

## 2020-11-15 MED ORDER — SUGAMMADEX SODIUM 200 MG/2ML IV SOLN
INTRAVENOUS | Status: DC | PRN
  Administered 2020-11-15: 200 mg via INTRAVENOUS

## 2020-11-15 MED ORDER — OXYCODONE HCL 5 MG PO TABS
ORAL_TABLET | ORAL | Status: AC
  Filled 2020-11-15: qty 1

## 2020-11-15 MED ORDER — CHLORHEXIDINE GLUCONATE 0.12 % MT SOLN
OROMUCOSAL | Status: AC
  Administered 2020-11-15: 15 mL via OROMUCOSAL
  Filled 2020-11-15: qty 15

## 2020-11-15 MED ORDER — MIDAZOLAM HCL 2 MG/2ML IJ SOLN
INTRAMUSCULAR | Status: DC | PRN
  Administered 2020-11-15: 2 mg via INTRAVENOUS

## 2020-11-15 MED ORDER — OXYCODONE HCL 5 MG PO TABS
5.0000 mg | ORAL_TABLET | Freq: Once | ORAL | Status: AC
  Administered 2020-11-15: 5 mg via ORAL

## 2020-11-15 MED ORDER — BUPIVACAINE-EPINEPHRINE (PF) 0.25% -1:200000 IJ SOLN
INTRAMUSCULAR | Status: AC
  Filled 2020-11-15: qty 30

## 2020-11-15 MED ORDER — HYDROCODONE-ACETAMINOPHEN 5-325 MG PO TABS: 1.0000 | ORAL_TABLET | ORAL | 0 refills | Status: AC | PRN

## 2020-11-15 MED ORDER — PROPOFOL 10 MG/ML IV BOLUS
INTRAVENOUS | Status: DC | PRN
  Administered 2020-11-15: 200 mg via INTRAVENOUS

## 2020-11-15 MED ORDER — FENTANYL CITRATE (PF) 100 MCG/2ML IJ SOLN
INTRAMUSCULAR | Status: AC
  Filled 2020-11-15: qty 2

## 2020-11-15 MED ORDER — FENTANYL CITRATE (PF) 100 MCG/2ML IJ SOLN
25.0000 ug | INTRAMUSCULAR | Status: DC | PRN
  Administered 2020-11-15: 50 ug via INTRAVENOUS
  Administered 2020-11-15 (×2): 25 ug via INTRAVENOUS

## 2020-11-15 MED ORDER — KETOROLAC TROMETHAMINE 30 MG/ML IJ SOLN
INTRAMUSCULAR | Status: DC | PRN
  Administered 2020-11-15: 30 mg via INTRAVENOUS

## 2020-11-15 MED ORDER — BUPIVACAINE-EPINEPHRINE 0.25% -1:200000 IJ SOLN
INTRAMUSCULAR | Status: DC | PRN
  Administered 2020-11-15: 30 mL

## 2020-11-15 MED ORDER — LACTATED RINGERS IV SOLN: INTRAVENOUS | Status: DC | PRN

## 2020-11-15 MED ORDER — ROCURONIUM BROMIDE 100 MG/10ML IV SOLN
INTRAVENOUS | Status: DC | PRN
  Administered 2020-11-15: 50 mg via INTRAVENOUS
  Administered 2020-11-15: 20 mg via INTRAVENOUS

## 2020-11-15 MED ORDER — MIDAZOLAM HCL 2 MG/2ML IJ SOLN
INTRAMUSCULAR | Status: AC
  Filled 2020-11-15: qty 2

## 2020-11-15 MED ORDER — FAMOTIDINE 20 MG PO TABS
ORAL_TABLET | ORAL | Status: AC
  Administered 2020-11-15: 20 mg via ORAL
  Filled 2020-11-15: qty 1

## 2020-11-15 MED ORDER — PROPOFOL 10 MG/ML IV BOLUS
INTRAVENOUS | Status: AC
  Filled 2020-11-15: qty 20

## 2020-11-15 MED ORDER — ACETAMINOPHEN 10 MG/ML IV SOLN
INTRAVENOUS | Status: DC | PRN
  Administered 2020-11-15: 1000 mg via INTRAVENOUS

## 2020-11-15 MED ORDER — DEXAMETHASONE SODIUM PHOSPHATE 10 MG/ML IJ SOLN
INTRAMUSCULAR | Status: DC | PRN
  Administered 2020-11-15: 10 mg via INTRAVENOUS

## 2020-11-15 SURGICAL SUPPLY — 54 items
ADH SKN CLS APL DERMABOND .7 (GAUZE/BANDAGES/DRESSINGS) ×2
APL PRP STRL LF DISP 70% ISPRP (MISCELLANEOUS) ×2
BAG INFUSER PRESSURE 100CC (MISCELLANEOUS) IMPLANT
BAG SPEC RTRVL LRG 6X4 10 (ENDOMECHANICALS) ×2
BLADE SURG SZ11 CARB STEEL (BLADE) ×3 IMPLANT
CANISTER SUCT 1200ML W/VALVE (MISCELLANEOUS) ×2 IMPLANT
CANNULA REDUC XI 12-8 STAPL (CANNULA) ×1
CANNULA REDUCER 12-8 DVNC XI (CANNULA) ×2 IMPLANT
CHLORAPREP W/TINT 26 (MISCELLANEOUS) ×3 IMPLANT
CLIP VESOLOCK MED LG 6/CT (CLIP) ×3 IMPLANT
COVER WAND RF STERILE (DRAPES) ×3 IMPLANT
DECANTER SPIKE VIAL GLASS SM (MISCELLANEOUS) ×6 IMPLANT
DEFOGGER SCOPE WARMER CLEARIFY (MISCELLANEOUS) ×3 IMPLANT
DERMABOND ADVANCED (GAUZE/BANDAGES/DRESSINGS) ×1
DERMABOND ADVANCED .7 DNX12 (GAUZE/BANDAGES/DRESSINGS) ×2 IMPLANT
DRAPE ARM DVNC X/XI (DISPOSABLE) ×8 IMPLANT
DRAPE COLUMN DVNC XI (DISPOSABLE) ×2 IMPLANT
DRAPE DA VINCI XI ARM (DISPOSABLE) ×4
DRAPE DA VINCI XI COLUMN (DISPOSABLE) ×1
ELECT REM PT RETURN 9FT ADLT (ELECTROSURGICAL) ×3
ELECTRODE REM PT RTRN 9FT ADLT (ELECTROSURGICAL) ×2 IMPLANT
GLOVE SURG ENC MOIS LTX SZ6.5 (GLOVE) ×8 IMPLANT
GLOVE SURG UNDER POLY LF SZ6.5 (GLOVE) ×8 IMPLANT
GOWN STRL REUS W/ TWL LRG LVL3 (GOWN DISPOSABLE) ×6 IMPLANT
GOWN STRL REUS W/TWL LRG LVL3 (GOWN DISPOSABLE) ×15
GRASPER SUT TROCAR 14GX15 (MISCELLANEOUS) ×3 IMPLANT
IRRIGATOR SUCT 8 DISP DVNC XI (IRRIGATION / IRRIGATOR) IMPLANT
IRRIGATOR SUCTION 8MM XI DISP (IRRIGATION / IRRIGATOR)
IV NS 1000ML (IV SOLUTION)
IV NS 1000ML BAXH (IV SOLUTION) IMPLANT
KIT PINK PAD W/HEAD ARE REST (MISCELLANEOUS) ×3
KIT PINK PAD W/HEAD ARM REST (MISCELLANEOUS) ×2 IMPLANT
LABEL OR SOLS (LABEL) ×3 IMPLANT
MANIFOLD NEPTUNE II (INSTRUMENTS) ×3 IMPLANT
NDL INSUFFLATION 14GA 120MM (NEEDLE) ×2 IMPLANT
NEEDLE HYPO 22GX1.5 SAFETY (NEEDLE) ×3 IMPLANT
NEEDLE INSUFFLATION 14GA 120MM (NEEDLE) ×3 IMPLANT
NS IRRIG 500ML POUR BTL (IV SOLUTION) ×3 IMPLANT
OBTURATOR OPTICAL STANDARD 8MM (TROCAR) ×1
OBTURATOR OPTICAL STND 8 DVNC (TROCAR) ×2
OBTURATOR OPTICALSTD 8 DVNC (TROCAR) ×2 IMPLANT
PACK LAP CHOLECYSTECTOMY (MISCELLANEOUS) ×3 IMPLANT
POUCH SPECIMEN RETRIEVAL 10MM (ENDOMECHANICALS) ×3 IMPLANT
SEAL CANN UNIV 5-8 DVNC XI (MISCELLANEOUS) ×6 IMPLANT
SEAL XI 5MM-8MM UNIVERSAL (MISCELLANEOUS) ×3
SET TUBE SMOKE EVAC HIGH FLOW (TUBING) ×3 IMPLANT
SOLUTION ELECTROLUBE (MISCELLANEOUS) ×3 IMPLANT
SPONGE LAP 4X18 RFD (DISPOSABLE) IMPLANT
STAPLER CANNULA SEAL DVNC XI (STAPLE) ×2 IMPLANT
STAPLER CANNULA SEAL XI (STAPLE) ×1
SUT MNCRL 4-0 (SUTURE) ×3
SUT MNCRL 4-0 27XMFL (SUTURE) ×2
SUT VICRYL 0 AB UR-6 (SUTURE) ×3 IMPLANT
SUTURE MNCRL 4-0 27XMF (SUTURE) ×2 IMPLANT

## 2020-11-15 NOTE — Anesthesia Procedure Notes (Signed)
Procedure Name: Intubation Date/Time: 11/15/2020 7:44 AM Performed by: Philbert Riser, CRNA Pre-anesthesia Checklist: Patient identified, Emergency Drugs available, Suction available and Patient being monitored Patient Re-evaluated:Patient Re-evaluated prior to induction Oxygen Delivery Method: Circle system utilized Preoxygenation: Pre-oxygenation with 100% oxygen Induction Type: IV induction Ventilation: Mask ventilation without difficulty Laryngoscope Size: McGraph and 3 Grade View: Grade I Tube type: Oral Tube size: 7.0 mm Number of attempts: 1 Airway Equipment and Method: Stylet and Oral airway Placement Confirmation: ETT inserted through vocal cords under direct vision, positive ETCO2 and breath sounds checked- equal and bilateral Tube secured with: Tape Dental Injury: Teeth and Oropharynx as per pre-operative assessment

## 2020-11-15 NOTE — Progress Notes (Signed)
Pt pain is 5/10 after 141mcg of Fentanyl. Dr. Rosey Bath notified. Acknowledged. Orders received.
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
## 2020-11-15 NOTE — Interval H&P Note (Signed)
History and Physical Interval Note:  11/15/2020 6:54 AM  Christina Jacobson  has presented today for surgery, with the diagnosis of K80.20 Cholelithiasis w/o cholecystitis.  The various methods of treatment have been discussed with the patient and family. After consideration of risks, benefits and other options for treatment, the patient has consented to  Procedure(s): XI ROBOTIC [place] w/ cholangiogram (N/A) as a surgical intervention.  The patient's history has been reviewed, patient examined, no change in status, stable for surgery.  I have reviewed the patient's chart and labs.  Questions were answered to the patient's satisfaction.     Herbert Pun

## 2020-11-15 NOTE — Transfer of Care (Signed)
Immediate Anesthesia Transfer of Care Note  Patient: Christina Jacobson  Procedure(s) Performed: XI ROBOTIC ASSISTED LAPAROSCOPIC CHOLECYSTECTOMY w/ cholangiogram (Abdomen) INDOCYANINE GREEN FLUORESCENCE IMAGING (ICG)  Patient Location: PACU  Anesthesia Type:General  Level of Consciousness: awake and alert   Airway & Oxygen Therapy: Patient Spontanous Breathing and Patient connected to face mask oxygen  Post-op Assessment: Report given to RN and Post -op Vital signs reviewed and stable  Post vital signs: Reviewed and stable  Last Vitals:  Vitals Value Taken Time  BP 87/78 11/15/20 0917  Temp    Pulse 83 11/15/20 0919  Resp 18 11/15/20 0919  SpO2 95 % 11/15/20 0919  Vitals shown include unvalidated device data.  Last Pain:  Vitals:   11/15/20 0624  TempSrc: Temporal  PainSc: 8          Complications: No notable events documented.

## 2020-11-15 NOTE — Discharge Instructions (Addendum)
  Diet: Resume home heart healthy regular diet.   Activity: No heavy lifting >20 pounds (children, pets, laundry, garbage) or strenuous activity until follow-up, but light activity and walking are encouraged. Do not drive or drink alcohol if taking narcotic pain medications.  Wound care: May shower with soapy water and pat dry (do not rub incisions), but no baths or submerging incision underwater until follow-up. (no swimming)   Medications: Resume all home medications. For mild to moderate pain: acetaminophen (Tylenol) ***or ibuprofen (if no kidney disease). Combining Tylenol with alcohol can substantially increase your risk of causing liver disease. Narcotic pain medications, if prescribed, can be used for severe pain, though may cause nausea, constipation, and drowsiness. Do not combine Tylenol and Norco within a 6 hour period as Norco contains Tylenol. If you do not need the narcotic pain medication, you do not need to fill the prescription.  Call office ([phone]) at any time if any questions, worsening pain, fevers/chills, bleeding, drainage from incision site, or other concerns.   AMBULATORY SURGERY  DISCHARGE INSTRUCTIONS   The drugs that you were given will stay in your system until tomorrow so for the next 24 hours you should not:  Drive an automobile Make any legal decisions Drink any alcoholic beverage   You may resume regular meals tomorrow.  Today it is better to start with liquids and gradually work up to solid foods.  You may eat anything you prefer, but it is better to start with liquids, then soup and crackers, and gradually work up to solid foods.   Please notify your doctor immediately if you have any unusual bleeding, trouble breathing, redness and pain at the surgery site, drainage, fever, or pain not relieved by medication.    Additional Instructions:        Please contact your physician with any problems or Same Day Surgery at [phone], Monday  through Friday 6 am to 4 pm, or [place] [facility] number at [phone].  

## 2020-11-15 NOTE — Op Note (Signed)
Preoperative diagnosis: Cholelithiasis  Postoperative diagnosis: Cholelithiasis  Procedure: Robotic Assisted Laparoscopic Cholecystectomy.   Anesthesia: GETA   Surgeon: Dr. Windell Moment  Wound Classification: Clean Contaminated  Indications: Patient is a 40 y.o. female developed right upper quadrant pain, nausea and on workup was found to have cholelithiasis with a normal common duct. Robotic Assisted Laparoscopic cholecystectomy was elected.  Findings:  Critical view of safety achieved Cystic duct and artery identified, ligated and divided Adequate hemostasis  Description of procedure: The patient was placed on the operating table in the supine position. General anesthesia was induced. A time-out was completed verifying correct patient, procedure, site, positioning, and implant(s) and/or special equipment prior to beginning this procedure. An orogastric tube was placed. The abdomen was prepped and draped in the usual sterile fashion.  An incision was made in a natural skin line below the umbilicus.  The fascia was elevated and the Veress needle inserted. Proper position was confirmed by aspiration and saline meniscus test.  The abdomen was insufflated with carbon dioxide to a pressure of 15 mmHg. The patient tolerated insufflation well. A 8-mm trocar was then inserted in optiview fashion.  The laparoscope was inserted and the abdomen inspected. No injuries from initial trocar placement were noted. Additional trocars were then inserted in the following locations: an 8-mm trocar in the left lateral abdomen, and another two 8-mm trocars to the right side of the abdomen 5 cm appart. The umbilical trocar was changed to a 12 mm trocar all under direct visualization. The abdomen was inspected and no abnormalities were found. The table was placed in the reverse Trendelenburg position with the right side up. The robotic arms were docked and target anatomy identified. Instrument inserted under direct  visualization.  Filmy adhesions between the gallbladder and omentum, duodenum and transverse colon were lysed with electrocautery. The dome of the gallbladder was grasped with a prograsp and retracted over the dome of the liver. The infundibulum was also grasped with an atraumatic grasper and retracted toward the right lower quadrant. This maneuver exposed Calot's triangle. The peritoneum overlying the gallbladder infundibulum was then incised and the cystic duct and cystic artery identified and circumferentially dissected. Critical view of safety reviewed before ligating any structure. Firefly images taken to visualize biliary ducts. The cystic duct and cystic artery were then doubly clipped and divided close to the gallbladder.  The gallbladder was then dissected from its peritoneal attachments by electrocautery. Hemostasis was checked and the gallbladder and contained stones were removed using an endoscopic retrieval bag. The gallbladder was passed off the table as a specimen.  There was no evidence of bleeding from the gallbladder fossa or cystic artery or leakage of the bile from the cystic duct stump. Secondary trocars were removed under direct vision. No bleeding was noted. The robotic arms were undoked. The scope was withdrawn and the umbilical trocar removed. The abdomen was allowed to collapse. The fascia of the 50mm trocar sites was closed with figure-of-eight 0 vicryl sutures. The skin was closed with subcuticular sutures of 4-0 monocryl and topical skin adhesive. The orogastric tube was removed.  The patient tolerated the procedure well and was taken to the postanesthesia care unit in stable condition.   Specimen: Gallbladder  Complications: None  EBL: 5 mL

## 2020-11-16 LAB — SURGICAL PATHOLOGY

## 2020-11-16 NOTE — Anesthesia Postprocedure Evaluation (Signed)
Anesthesia Post Note  Patient: Sheilyn Lavell  Procedure(s) Performed: XI ROBOTIC ASSISTED LAPAROSCOPIC CHOLECYSTECTOMY w/ cholangiogram (Abdomen) INDOCYANINE GREEN FLUORESCENCE IMAGING (ICG)  Patient location during evaluation: PACU Anesthesia Type: General Level of consciousness: awake and alert Pain management: pain level controlled Vital Signs Assessment: post-procedure vital signs reviewed and stable Respiratory status: spontaneous breathing, nonlabored ventilation, respiratory function stable and patient connected to nasal cannula oxygen Cardiovascular status: blood pressure returned to baseline and stable Postop Assessment: no apparent nausea or vomiting Anesthetic complications: no   No notable events documented.   Last Vitals:  Vitals:   11/15/20 1000 11/15/20 1014  BP: 125/74 (!) 141/84  Pulse: 87 75  Resp: 12 15  Temp: (!) 36.4 C (!) 36.4 C  SpO2: 94% 99%    Last Pain:  Vitals:   11/15/20 1014  TempSrc: Temporal  PainSc: 4                  Martha Clan

## 2021-03-01 ENCOUNTER — Other Ambulatory Visit: Payer: Self-pay | Admitting: Orthopedic Surgery

## 2021-03-01 ENCOUNTER — Other Ambulatory Visit (HOSPITAL_COMMUNITY): Payer: Self-pay | Admitting: Orthopedic Surgery

## 2021-03-01 DIAGNOSIS — M25561 Pain in right knee: Secondary | ICD-10-CM

## 2021-03-06 ENCOUNTER — Emergency Department
Admission: EM | Admit: 2021-03-06 | Discharge: 2021-03-06 | Disposition: A | Discharge status: Home / Self Care | Payer: Medicaid Other | Attending: Emergency Medicine | Admitting: Emergency Medicine

## 2021-03-06 ENCOUNTER — Other Ambulatory Visit: Payer: Self-pay

## 2021-03-06 DIAGNOSIS — F17210 Nicotine dependence, cigarettes, uncomplicated: Secondary | ICD-10-CM | POA: Diagnosis not present

## 2021-03-06 DIAGNOSIS — Z79899 Other long term (current) drug therapy: Secondary | ICD-10-CM | POA: Diagnosis not present

## 2021-03-06 DIAGNOSIS — M79661 Pain in right lower leg: Secondary | ICD-10-CM | POA: Insufficient documentation

## 2021-03-06 DIAGNOSIS — M25561 Pain in right knee: Secondary | ICD-10-CM | POA: Diagnosis present

## 2021-03-06 DIAGNOSIS — Z85828 Personal history of other malignant neoplasm of skin: Secondary | ICD-10-CM | POA: Insufficient documentation

## 2021-03-06 DIAGNOSIS — M79604 Pain in right leg: Secondary | ICD-10-CM

## 2021-03-06 MED ORDER — HYDROCODONE-ACETAMINOPHEN 5-325 MG PO TABS: 1.0000 | ORAL_TABLET | Freq: Four times a day (QID) | ORAL | 0 refills | Status: AC | PRN

## 2021-03-06 NOTE — ED Triage Notes (Signed)
Pt to ED for lower back pain that started last night. Reports fall a few weeks ago hurting right knee, referred to ortho, pain started worsening last night.  Pt ambulatory with assistive devices.
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
## 2021-03-06 NOTE — ED Notes (Signed)
See triage note  presents with pain to right which is moving into upper leg and lower back  states she fell couple of weeks ago  waiting on MRI for her knee  states pain has gotten worse

## 2021-03-07 ENCOUNTER — Other Ambulatory Visit: Payer: Self-pay | Admitting: Student

## 2021-03-07 DIAGNOSIS — Z79899 Other long term (current) drug therapy: Secondary | ICD-10-CM

## 2021-03-08 ENCOUNTER — Other Ambulatory Visit: Payer: Self-pay

## 2021-03-08 ENCOUNTER — Ambulatory Visit
Admission: EM | Admit: 2021-03-08 | Discharge: 2021-03-08 | Disposition: A | Discharge status: Home / Self Care | Payer: Medicaid Other

## 2021-03-08 ENCOUNTER — Encounter: Payer: Self-pay | Admitting: Emergency Medicine

## 2021-03-08 DIAGNOSIS — R42 Dizziness and giddiness: Secondary | ICD-10-CM | POA: Diagnosis not present

## 2021-03-08 NOTE — Discharge Instructions (Addendum)
As we discussed your increased dizziness is msot likely related to your Lamotrigine and is being compounded by the addition of hydrocodone for pain.  You can either stop the hydrocodone to ease the dizziness or be more slow and deliberate with your movements. Especially with change of position from laying to sitting, and sitting to standing.  Keep your MRI appointments.  Follow-up with your Neurologist.

## 2021-03-08 NOTE — ED Triage Notes (Signed)
Pt c/o dizziness. Started about a week ago. She states her neurologist changed some on her medications. She has not contacted her neurologist about this.
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
## 2021-03-10 ENCOUNTER — Other Ambulatory Visit: Payer: Self-pay

## 2021-03-10 ENCOUNTER — Ambulatory Visit
Admission: RE | Admit: 2021-03-10 | Discharge: 2021-03-10 | Disposition: A | Discharge status: Home / Self Care | Payer: Medicaid Other | Source: Ambulatory Visit | Attending: Orthopedic Surgery | Admitting: Orthopedic Surgery

## 2021-03-10 DIAGNOSIS — M25561 Pain in right knee: Secondary | ICD-10-CM | POA: Insufficient documentation

## 2021-03-15 ENCOUNTER — Other Ambulatory Visit: Payer: Self-pay

## 2021-03-15 ENCOUNTER — Ambulatory Visit
Admission: RE | Admit: 2021-03-15 | Discharge: 2021-03-15 | Disposition: A | Discharge status: Home / Self Care | Payer: Medicaid Other | Source: Ambulatory Visit | Attending: Student | Admitting: Student

## 2021-03-15 DIAGNOSIS — Z79899 Other long term (current) drug therapy: Secondary | ICD-10-CM | POA: Diagnosis not present

## 2021-06-12 ENCOUNTER — Ambulatory Visit
Admission: EM | Admit: 2021-06-12 | Discharge: 2021-06-12 | Disposition: A | Discharge status: Home / Self Care | Payer: Medicaid Other | Attending: Internal Medicine | Admitting: Internal Medicine

## 2021-06-12 ENCOUNTER — Encounter: Payer: Self-pay | Admitting: Emergency Medicine

## 2021-06-12 ENCOUNTER — Other Ambulatory Visit: Payer: Self-pay

## 2021-06-12 DIAGNOSIS — M545 Low back pain, unspecified: Secondary | ICD-10-CM

## 2021-06-12 DIAGNOSIS — M25561 Pain in right knee: Secondary | ICD-10-CM

## 2021-06-12 MED ORDER — NORTRIPTYLINE HCL 25 MG PO CAPS: 25.0000 mg | ORAL_CAPSULE | Freq: Every day | ORAL | 0 refills | Status: DC
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
## 2021-06-12 NOTE — Discharge Instructions (Addendum)
R knee re-injury and left low back pain--exam is reassuring.  Would continue ibuprofen 600 mg 1-2 times daily and continue using ice.  Discussed diclofenac gel but cost is a barrier.  Lidocaine patches otc may also be helpful.  Prescription for nortriptyline, 25mg  at bedtime, sent to pharmacy to try to decrease overall perception of pain.  Followup with [place] to discuss best next steps.  Your primary care provider may also be a good resource, if pain is persistent.

## 2021-06-12 NOTE — ED Triage Notes (Signed)
Pt c/o right knee pain, and lower back pain. She states she had previously injured her right knee and had a cortisone injection but re injured it about 3 days ago. She states it is affecting her lower back.

## 2021-06-14 ENCOUNTER — Telehealth: Payer: Self-pay

## 2021-06-14 NOTE — Telephone Encounter (Signed)
Could not reach patient with phone number listed

## 2021-06-14 NOTE — Telephone Encounter (Signed)
-----   Message from Curt Jews, RN sent at 06/14/2021 11:21 AM EST ----- Regarding: UC to PCP Patient needs to establish with PCP - routine

## 2021-11-07 ENCOUNTER — Encounter: Payer: Self-pay | Admitting: Emergency Medicine

## 2021-11-07 ENCOUNTER — Emergency Department: Payer: Medicaid Other

## 2021-11-07 ENCOUNTER — Emergency Department
Admission: EM | Admit: 2021-11-07 | Discharge: 2021-11-08 | Disposition: A | Discharge status: Home / Self Care | Payer: Medicaid Other | Attending: Emergency Medicine | Admitting: Emergency Medicine

## 2021-11-07 DIAGNOSIS — Y92410 Unspecified street and highway as the place of occurrence of the external cause: Secondary | ICD-10-CM | POA: Insufficient documentation

## 2021-11-07 DIAGNOSIS — M545 Low back pain, unspecified: Secondary | ICD-10-CM | POA: Diagnosis present

## 2021-11-07 DIAGNOSIS — M25531 Pain in right wrist: Secondary | ICD-10-CM | POA: Diagnosis not present

## 2021-11-07 DIAGNOSIS — M25561 Pain in right knee: Secondary | ICD-10-CM

## 2021-11-07 DIAGNOSIS — Z85820 Personal history of malignant melanoma of skin: Secondary | ICD-10-CM | POA: Insufficient documentation

## 2021-11-07 NOTE — ED Triage Notes (Signed)
Pt presents via POV with right wrist, knee, and lower back pain. 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 She notes that the pain has been getting worse over the last several days. Hx of herniated discs. Denies falls or injury.

## 2021-11-08 MED ORDER — OXYCODONE HCL 5 MG PO TABS: 5.0000 mg | ORAL_TABLET | Freq: Four times a day (QID) | ORAL | 0 refills | Status: DC | PRN

## 2021-11-08 MED ORDER — KETOROLAC TROMETHAMINE 30 MG/ML IJ SOLN
60.0000 mg | Freq: Once | INTRAMUSCULAR | Status: AC
  Administered 2021-11-08: 60 mg via INTRAMUSCULAR
  Filled 2021-11-08: qty 2

## 2021-11-08 MED ORDER — IBUPROFEN 800 MG PO TABS: 800.0000 mg | ORAL_TABLET | Freq: Three times a day (TID) | ORAL | 0 refills | Status: DC | PRN

## 2021-11-08 MED ORDER — ONDANSETRON 4 MG PO TBDP: 4.0000 mg | ORAL_TABLET | Freq: Four times a day (QID) | ORAL | 0 refills | Status: DC | PRN

## 2021-11-08 NOTE — Discharge Instructions (Addendum)
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
## 2022-10-21 IMAGING — US US ABDOMEN LIMITED
1 series · 14 of 25 positions shown · non-contrast
Comparison: None.

CLINICAL DATA: Right upper quadrant abdominal pain 3 days. Elevated
liver function tests.

EXAM:
ULTRASOUND ABDOMEN LIMITED RIGHT UPPER QUADRANT

[Series 1: us abdomen limited ruq (liver/gb) · 14 of 46 slices shown]
[im 1/46]
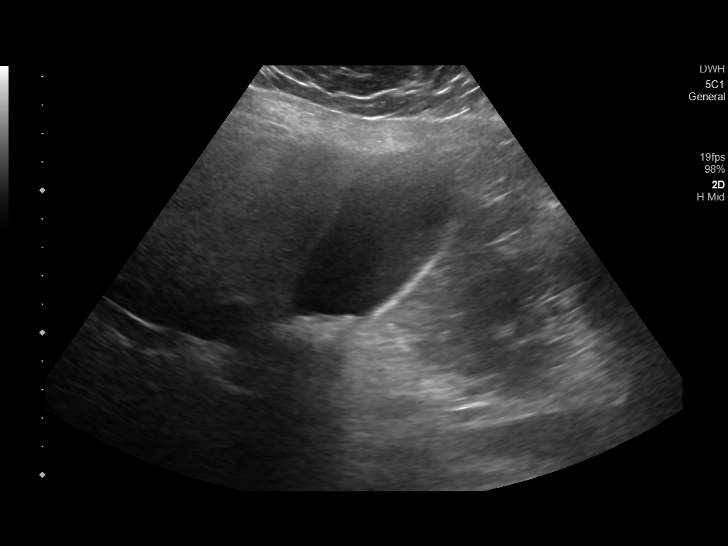
[im 4/46]
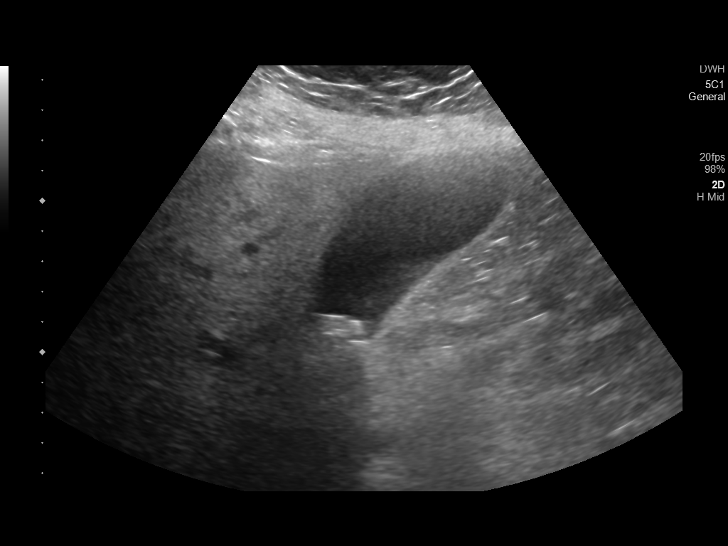
[im 8/46]
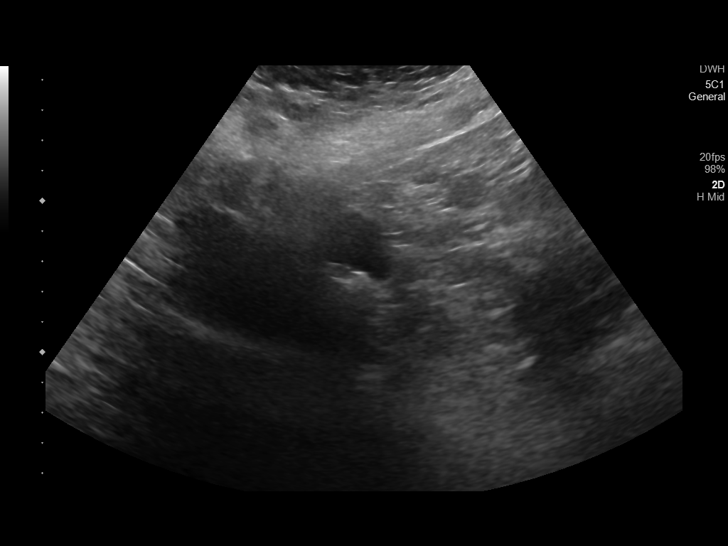
[im 12/46]
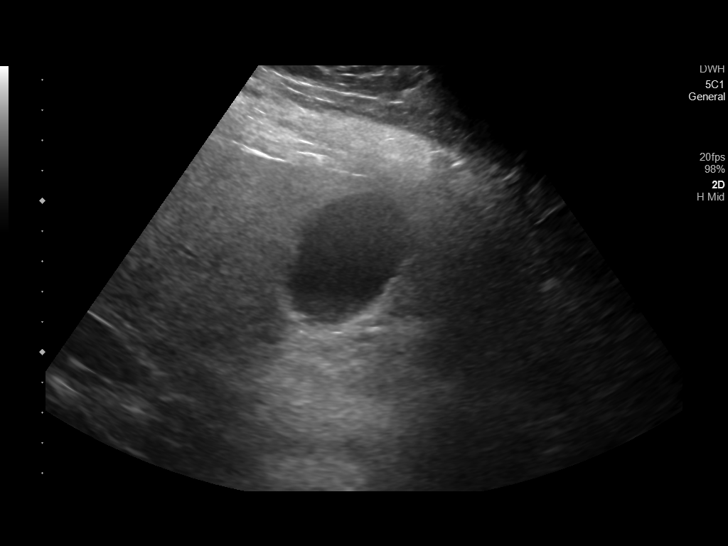
[im 16/46]
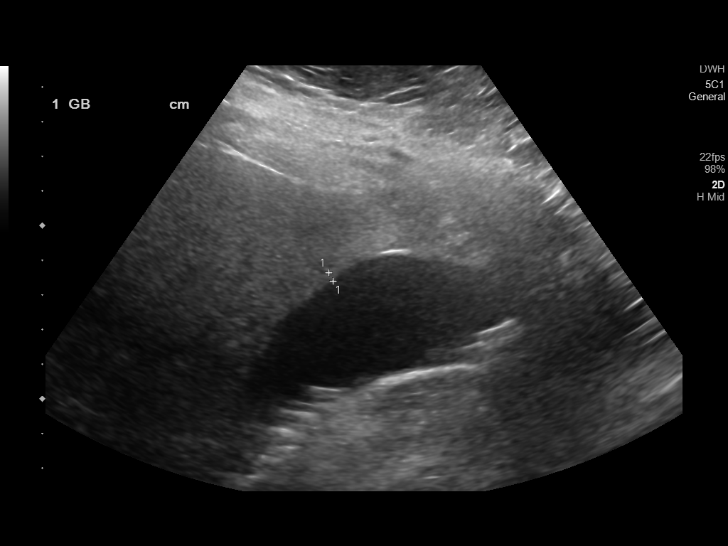
[im 17/46]
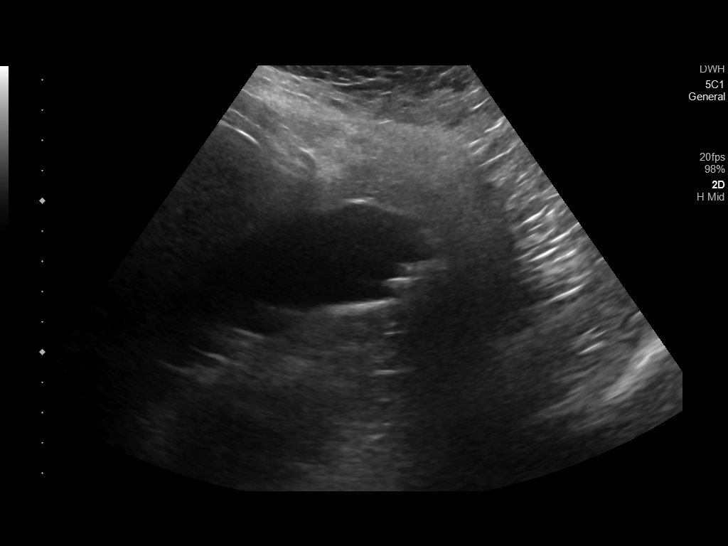
[im 21/46]
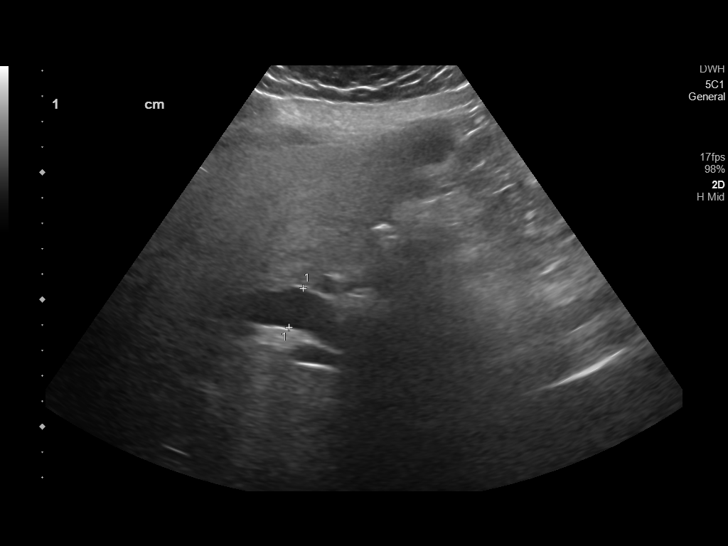
[im 25/46]
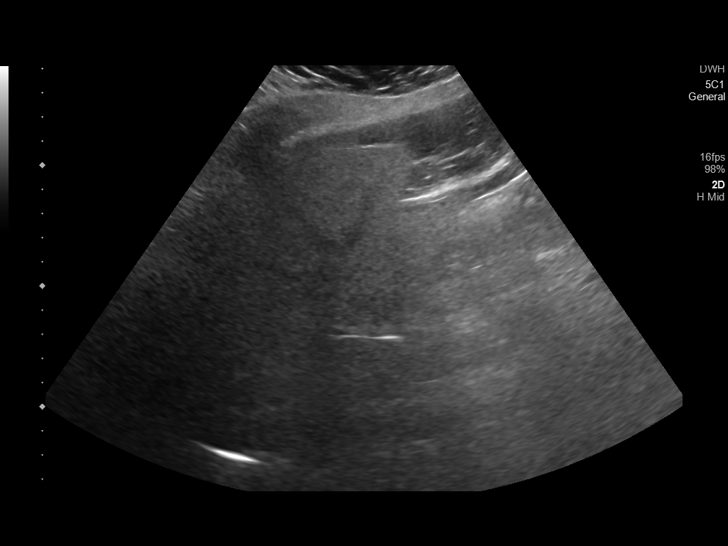
[im 29/46]
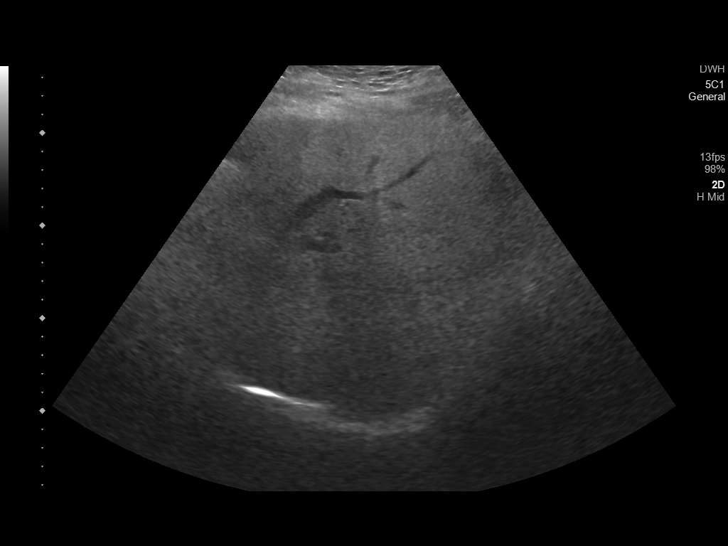
[im 31/46]
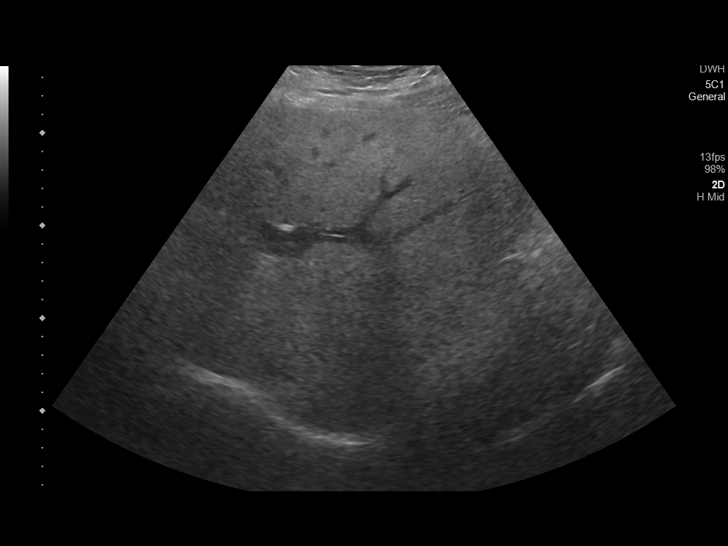
[im 34/46]
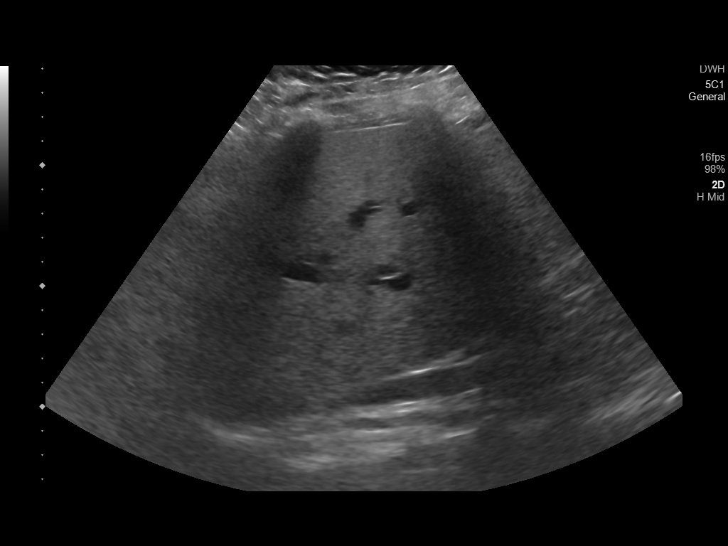
[im 38/46]
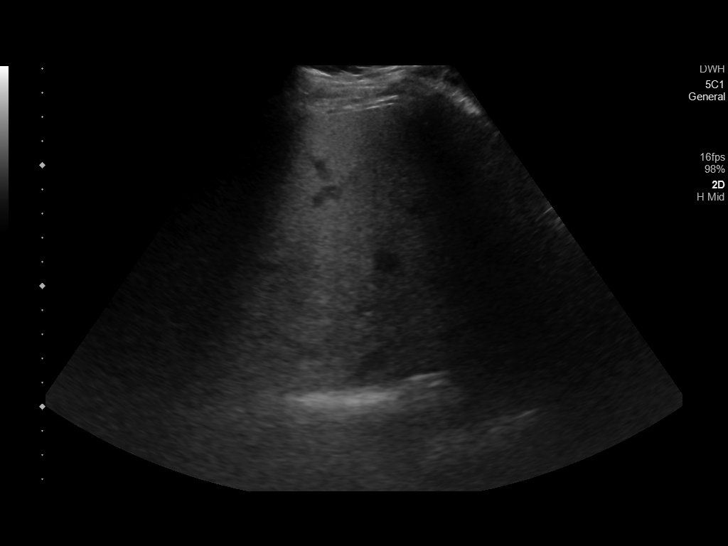
[im 42/46]
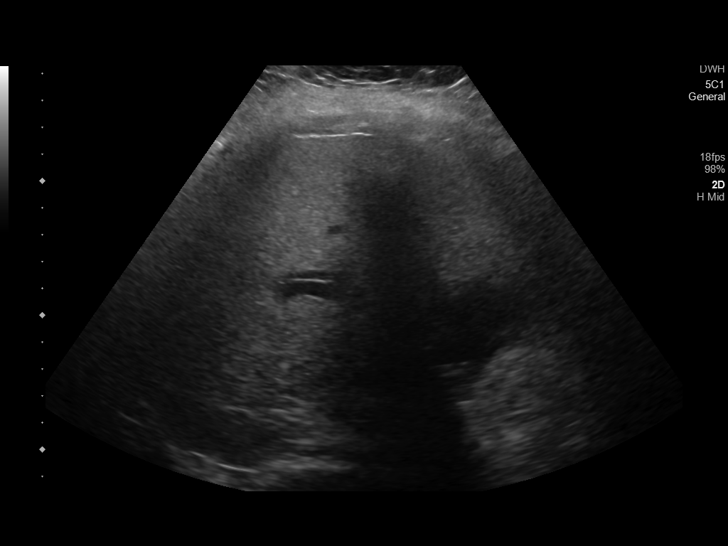
[im 46/46]
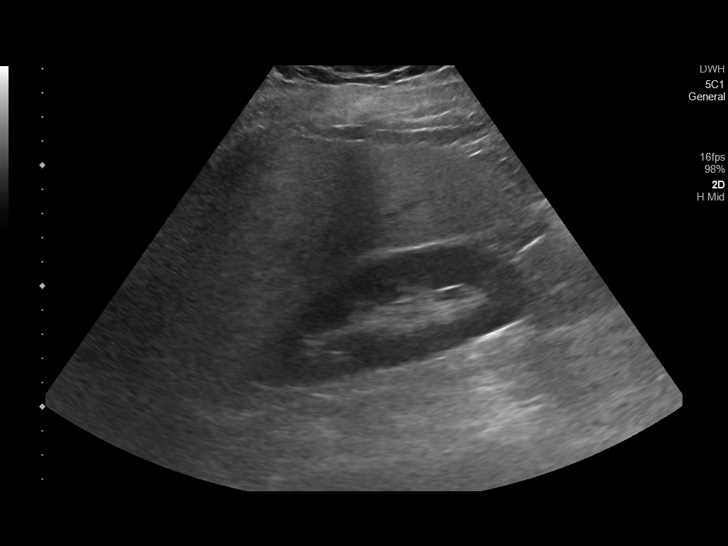

[14 of 25 positions shown; findings below may reference images not displayed]

FINDINGS: Gallbladder:

Gallstones noted within the gallbladder lumen measuring up to 8 mm.
No gallbladder wall thickening or pericholecystic fluid visualized.
No sonographic Murphy sign noted by sonographer.

Common bile duct:

Diameter: Dilated measuring up 16 mm.

Liver:

No focal lesion identified. Increased parenchymal echogenicity.
Portal vein is patent on color Doppler imaging with normal direction
of blood flow towards the liver.

Other: None.
IMPRESSION: 1. Cholelithiasis with concern for choledocholithiasis given dilated
common bile duct (16mm). No findings of acute cholecystitis.
2. Hepatic steatosis Please note limited evaluation for focal
hepatic masses in a patient with hepatic steatosis due to decreased
penetration of the acoustic ultrasound waves.

## 2022-12-09 ENCOUNTER — Encounter: Payer: Self-pay | Admitting: Emergency Medicine

## 2022-12-09 ENCOUNTER — Ambulatory Visit
Admission: EM | Admit: 2022-12-09 | Discharge: 2022-12-09 | Disposition: A | Discharge status: Home / Self Care | Payer: Medicaid Other | Attending: Physician Assistant | Admitting: Physician Assistant

## 2022-12-09 DIAGNOSIS — M542 Cervicalgia: Secondary | ICD-10-CM

## 2022-12-09 DIAGNOSIS — G8929 Other chronic pain: Secondary | ICD-10-CM

## 2022-12-09 DIAGNOSIS — G44209 Tension-type headache, unspecified, not intractable: Secondary | ICD-10-CM

## 2022-12-09 MED ORDER — KETOROLAC TROMETHAMINE 30 MG/ML IJ SOLN
30.0000 mg | Freq: Once | INTRAMUSCULAR | Status: AC
  Administered 2022-12-09: 30 mg via INTRAMUSCULAR

## 2022-12-09 MED ORDER — KETOROLAC TROMETHAMINE 10 MG PO TABS: 10.0000 mg | ORAL_TABLET | Freq: Four times a day (QID) | ORAL | 0 refills | Status: DC | PRN

## 2022-12-09 MED ORDER — CYCLOBENZAPRINE HCL 10 MG PO TABS: 10.0000 mg | ORAL_TABLET | Freq: Two times a day (BID) | ORAL | 0 refills | Status: DC | PRN

## 2022-12-09 NOTE — Discharge Instructions (Signed)
NECK PAIN: Stressed avoiding painful activities. This can exacerbate your symptoms and make them worse.  May apply heat to the areas of pain for some relief. Use medications as directed. Be aware of which medications make you drowsy and do not drive or operate any kind of heavy machinery while using the medication (ie pain medications or muscle relaxers). F/U with PCP for reexamination or return sooner if condition worsens or does not begin to improve over the next few days.   NECK PAIN RED FLAGS: If symptoms get worse than they are right now, you should come back sooner for re-evaluation. If you have increased numbness/ tingling or notice that the numbness/tingling is affecting the legs or saddle region, go to ER. If you ever lose continence go to ER.      Go to ER if severe/worsening headache, falls, feeling more faint. Contact Medicaid to figure out who your PCP is so you can make appt.

## 2022-12-09 NOTE — ED Triage Notes (Signed)
Pt presents with a headache that started today. The headache is accompanied with light sensitivity, nausea and neck pain. Pt has tried Midol, heat and ice for pain relief.
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
## 2023-07-28 ENCOUNTER — Ambulatory Visit
Admission: EM | Admit: 2023-07-28 | Discharge: 2023-07-28 | Disposition: A | Discharge status: Home / Self Care | Payer: MEDICAID | Attending: Family Medicine | Admitting: Family Medicine

## 2023-07-28 ENCOUNTER — Encounter: Payer: Self-pay | Admitting: Emergency Medicine

## 2023-07-28 DIAGNOSIS — H00024 Hordeolum internum left upper eyelid: Secondary | ICD-10-CM

## 2023-07-28 MED ORDER — ERYTHROMYCIN 5 MG/GM OP OINT: TOPICAL_OINTMENT | OPHTHALMIC | 0 refills | Status: DC

## 2023-07-28 NOTE — Discharge Instructions (Addendum)
  [facility] - Call to figure out your health care providers   Stop by the pharmacy to pick up your antibiotic eye medication.  Follow up with your primary eyecare provider or [facility] if symptoms suddenly worsen or you have little improvement in your eye symptoms.
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
## 2023-07-28 NOTE — ED Triage Notes (Addendum)
 Patient presents with c/o left eye swelling and itching that started today. Patient also states vision is blurry. Patient also states she stopped all PO medications.

## 2023-08-23 ENCOUNTER — Emergency Department
Admission: EM | Admit: 2023-08-23 | Discharge: 2023-08-23 | Disposition: A | Discharge status: Home / Self Care | Payer: MEDICAID | Attending: Family Medicine | Admitting: Family Medicine

## 2023-08-23 ENCOUNTER — Other Ambulatory Visit: Payer: Self-pay

## 2023-08-23 DIAGNOSIS — M5431 Sciatica, right side: Secondary | ICD-10-CM

## 2023-08-23 DIAGNOSIS — M5441 Lumbago with sciatica, right side: Secondary | ICD-10-CM | POA: Insufficient documentation

## 2023-08-23 DIAGNOSIS — M545 Low back pain, unspecified: Secondary | ICD-10-CM | POA: Diagnosis present

## 2023-08-23 MED ORDER — HYDROCODONE-ACETAMINOPHEN 5-325 MG PO TABS: 1.0000 | ORAL_TABLET | Freq: Four times a day (QID) | ORAL | 0 refills | Status: AC | PRN

## 2023-08-23 MED ORDER — CYCLOBENZAPRINE HCL 10 MG PO TABS: 10.0000 mg | ORAL_TABLET | Freq: Three times a day (TID) | ORAL | 0 refills | Status: DC | PRN

## 2023-08-23 MED ORDER — PREDNISONE 20 MG PO TABS
60.0000 mg | ORAL_TABLET | Freq: Once | ORAL | Status: AC
  Administered 2023-08-23: 60 mg via ORAL
  Filled 2023-08-23: qty 3

## 2023-08-23 MED ORDER — PREDNISONE 10 MG (21) PO TBPK: ORAL_TABLET | ORAL | 0 refills | Status: DC

## 2023-08-23 NOTE — ED Triage Notes (Signed)
 Pt to ed from home via POV for right sided leg pain. Pt states it starts in her lower back and shoots down her right leg. Pt has HX of sciatica nerve pain. Pt is caox4, in no acute distress and ambulatory in triage,

## 2023-08-23 NOTE — Discharge Instructions (Signed)
 Please follow up with your neurologist if not improving over the week.  Return to the ER for symptoms that change or worsen if unable to see primary care or the neurologist

## 2023-08-23 NOTE — ED Provider Notes (Signed)
 [facility] Provider Note    None    (approximate)   History   Leg Pain (Right )   HPI  Christina Jacobson is a 43 y.o. female  with history of sciatica, epilepsy, Bipolar 1 and as listed in EMR presents to the emergency department for treatment and evaluation of right side lumbar back pain that radiates into the right leg.  Similar symptoms in the past.  She has recently started a new job and feels that this has contributed to her current symptoms.  She typically takes Flexeril and is okay but today Flexeril did not help.      Physical Exam   Triage Vital Signs: ED Triage Vitals  Encounter Vitals Group     BP 08/23/23 2212 (!) 161/95     Systolic BP Percentile --      Diastolic BP Percentile --      Pulse Rate 08/23/23 2212 81     Resp 08/23/23 2212 18     Temp 08/23/23 2212 98.6 F (37 C)     Temp Source 08/23/23 2212 Oral     SpO2 08/23/23 2212 95 %     Weight --      Height 08/23/23 2212 5\' 11"  (1.803 m)     Head Circumference --      Peak Flow --      Pain Score 08/23/23 2212 10     Pain Loc --      Pain Education --      Exclude from Growth Chart --     Most recent vital signs: Vitals:   08/23/23 2212  BP: (!) 161/95  Pulse: 81  Resp: 18  Temp: 98.6 F (37 C)  SpO2: 95%    General: Awake, no distress.  CV:  Good peripheral perfusion.  Resp:  Normal effort.  Abd:  No distention.  Other:  Ambulatory with steady gait.    ED Results / Procedures / Treatments   Labs (all labs ordered are listed, but only abnormal results are displayed) Labs Reviewed - No data to display   EKG  Not indicated.   RADIOLOGY  Image and radiology report reviewed and interpreted by me. Radiology report consistent with the same.  Not indicated  PROCEDURES:  Critical Care performed: No  Procedures   MEDICATIONS ORDERED IN ED:  Medications  predniSONE (DELTASONE) tablet 60 mg (60 mg Oral Given 08/23/23 2234)     IMPRESSION / MDM /  ASSESSMENT AND PLAN / ED COURSE   I have reviewed the triage note.  Differential diagnosis includes, but is not limited to, sciatica, lumbar radiculopathy  Patient's presentation is most consistent with acute illness / injury with system symptoms.  43 year old female presenting to the emergency department for treatment and evaluation of acute on chronic right side back and leg pain.  No new injury.  She has had this pain in the past and denies new symptoms. No red flags.  Plan will be to treat with prednisone, flexeril, and norco. She will follow up outpatient with her neurologist if not improving with medication and time.      FINAL CLINICAL IMPRESSION(S) / ED DIAGNOSES   Final diagnoses:  Sciatica of right side     Rx / DC Orders   ED Discharge Orders          Ordered    predniSONE (STERAPRED UNI-PAK 21 TAB) 10 MG (21) TBPK tablet        08/23/23 2232  cyclobenzaprine (FLEXERIL) 10 MG tablet  3 times daily PRN        08/23/23 2232    HYDROcodone-acetaminophen (NORCO/VICODIN) 5-325 MG tablet  Every 6 hours PRN        08/23/23 2232             Note:  This document was prepared using Dragon voice recognition software and may include unintentional dictation errors.   Chinita Pester, FNP 08/24/23 2310    Chesley Noon, MD 09/01/23 [id number]

## 2023-10-02 ENCOUNTER — Ambulatory Visit
Admission: EM | Admit: 2023-10-02 | Discharge: 2023-10-02 | Disposition: A | Discharge status: Home / Self Care | Payer: MEDICAID | Attending: Family Medicine | Admitting: Family Medicine

## 2023-10-02 DIAGNOSIS — L02212 Cutaneous abscess of back [any part, except buttock]: Secondary | ICD-10-CM

## 2023-10-02 MED ORDER — SULFAMETHOXAZOLE-TRIMETHOPRIM 800-160 MG PO TABS: 1.0000 | ORAL_TABLET | Freq: Two times a day (BID) | ORAL | 0 refills | Status: AC

## 2023-10-02 NOTE — Discharge Instructions (Signed)
 See aftercare handout for your incision and drainage.  Take Motrin  and/or Tylenol  as needed for pain.  Watch for signs of infection.  Stop by the pharmacy and pick up your antibiotics.  Take as prescribed.

## 2023-10-02 NOTE — ED Triage Notes (Signed)
 Pt states she has a cyst on her right upper shoulder x a few years, that has been getting bigger and painful in the past 3-4 days.
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
## 2024-03-28 ENCOUNTER — Other Ambulatory Visit: Payer: Self-pay

## 2024-03-28 ENCOUNTER — Emergency Department
Admission: EM | Admit: 2024-03-28 | Discharge: 2024-03-28 | Disposition: A | Discharge status: Home / Self Care | Payer: MEDICAID | Attending: Emergency Medicine | Admitting: Emergency Medicine

## 2024-03-28 DIAGNOSIS — Z760 Encounter for issue of repeat prescription: Secondary | ICD-10-CM | POA: Diagnosis not present

## 2024-03-28 DIAGNOSIS — G40209 Localization-related (focal) (partial) symptomatic epilepsy and epileptic syndromes with complex partial seizures, not intractable, without status epilepticus: Secondary | ICD-10-CM | POA: Insufficient documentation

## 2024-03-28 DIAGNOSIS — R569 Unspecified convulsions: Secondary | ICD-10-CM | POA: Diagnosis present

## 2024-03-28 HISTORY — DX: Atherosclerotic heart disease of native coronary artery without angina pectoris: I25.10

## 2024-03-28 LAB — CBC WITH DIFFERENTIAL/PLATELET
Abs Immature Granulocytes: 0.03 K/uL (ref 0.00–0.07)
Basophils Absolute: 0.1 K/uL (ref 0.0–0.1)
Basophils Relative: 1 %
Eosinophils Absolute: 0.5 K/uL (ref 0.0–0.5)
Eosinophils Relative: 4 %
HCT: 43.4 % (ref 36.0–46.0)
Hemoglobin: 15.3 g/dL — ABNORMAL HIGH (ref 12.0–15.0)
Immature Granulocytes: 0 %
Lymphocytes Relative: 26 %
Lymphs Abs: 3.2 K/uL (ref 0.7–4.0)
MCH: 30.8 pg (ref 26.0–34.0)
MCHC: 35.3 g/dL (ref 30.0–36.0)
MCV: 87.3 fL (ref 80.0–100.0)
Monocytes Absolute: 0.7 K/uL (ref 0.1–1.0)
Monocytes Relative: 6 %
Neutro Abs: 7.6 K/uL (ref 1.7–7.7)
Neutrophils Relative %: 63 %
Platelets: 275 K/uL (ref 150–400)
RBC: 4.97 MIL/uL (ref 3.87–5.11)
RDW: 12.4 % (ref 11.5–15.5)
WBC: 12.2 K/uL — ABNORMAL HIGH (ref 4.0–10.5)
nRBC: 0 % (ref 0.0–0.2)

## 2024-03-28 LAB — HEPATIC FUNCTION PANEL
ALT: 28 U/L (ref 0–44)
AST: 27 U/L (ref 15–41)
Albumin: 4.3 g/dL (ref 3.5–5.0)
Alkaline Phosphatase: 81 U/L (ref 38–126)
Bilirubin, Direct: <0.1 mg/dL (ref 0.0–0.2)
Total Bilirubin: 0.5 mg/dL (ref 0.0–1.2)
Total Protein: 7.6 g/dL (ref 6.5–8.1)

## 2024-03-28 LAB — BASIC METABOLIC PANEL WITH GFR
Anion gap: 11 (ref 5–15)
BUN: 7 mg/dL (ref 6–20)
CO2: 25 mmol/L (ref 22–32)
Calcium: 9 mg/dL (ref 8.9–10.3)
Chloride: 101 mmol/L (ref 98–111)
Creatinine, Ser: 0.86 mg/dL (ref 0.44–1.00)
GFR, Estimated: >60 mL/min (ref >60)
Glucose, Bld: 103 mg/dL — ABNORMAL HIGH (ref 70–99)
Potassium: 3.7 mmol/L (ref 3.5–5.1)
Sodium: 137 mmol/L (ref 135–145)

## 2024-03-28 MED ORDER — DIVALPROEX SODIUM 500 MG PO DR TAB
500.0000 mg | DELAYED_RELEASE_TABLET | Freq: Two times a day (BID) | ORAL | 1 refills | Status: AC
Start: 2024-03-28 — End: 2024-05-27

## 2024-03-28 NOTE — ED Notes (Signed)
 Pt states she is feeling better and is going to have people at home whom will monitor her throughout the night.

## 2024-03-28 NOTE — Discharge Instructions (Addendum)
 You should follow-up with your primary provider as discussed.  You should also reestablish care with GI medicine as well as neurology for ongoing medical management.

## 2024-03-28 NOTE — ED Provider Notes (Signed)
 [facility] Emergency Department Provider Note     Event Date/Time   First MD Initiated Contact with Patient 03/28/24 1846     (approximate)   History   feels like could have a seizure soon   HPI  Christina Jacobson is a 43 y.o. female with a history of seizure disorder, not currently on antiseizure therapy, IBS-D, GERD, anxiety, depression, bipolar disorder, PTSD, and sleep apnea, presents to the ED accompanied by herself from home.  She reports feeling as though she is floating on clouds..  Patient would describe the assistance program she typically gets before she has a partial seizure.  She has not been on seizure medication for at least the last 2 years.  She denies any recent seizure activity, syncope, or near syncope.  She denies any speech change, paralysis, vision change, tinnitus, vertigo, weakness.     Physical Exam   Triage Vital Signs: ED Triage Vitals  Encounter Vitals Group     BP 03/28/24 1833 (!) 160/96     Girls Systolic BP Percentile --      Girls Diastolic BP Percentile --      Boys Systolic BP Percentile --      Boys Diastolic BP Percentile --      Pulse Rate 03/28/24 1833 89     Resp 03/28/24 1833 20     Temp 03/28/24 1833 97.9 F (36.6 C)     Temp Source 03/28/24 1833 Oral     SpO2 03/28/24 1833 99 %     Weight 03/28/24 1835 200 lb (90.7 kg)     Height 03/28/24 1835 5' 11 (1.803 m)     Head Circumference --      Peak Flow --      Pain Score 03/28/24 1833 9     Pain Loc --      Pain Education --      Exclude from Growth Chart --     Most recent vital signs: Vitals:   03/28/24 1833  BP: (!) 160/96  Pulse: 89  Resp: 20  Temp: 97.9 F (36.6 C)  SpO2: 99%    General Awake, no distress. *** {**HEENT NCAT. PERRL. EOMI. No rhinorrhea. Mucous membranes are moist. **} CV:  Good peripheral perfusion. *** RESP:  Normal effort. *** ABD:  No distention. *** MSK:  *** NEURO: ***   ED Results / Procedures / Treatments    Labs (all labs ordered are listed, but only abnormal results are displayed) Labs Reviewed  CBC WITH DIFFERENTIAL/PLATELET - Abnormal; Notable for the following components:      Result Value   WBC 12.2 (*)    Hemoglobin 15.3 (*)    All other components within normal limits  BASIC METABOLIC PANEL WITH GFR     EKG  ***  RADIOLOGY  {**I personally viewed and evaluated these images as part of my medical decision making, as well as reviewing the written report by the radiologist.  ED Provider Interpretation: ***  No results found.   PROCEDURES:  Critical Care performed: No  Procedures   MEDICATIONS ORDERED IN ED: Medications - No data to display   IMPRESSION / MDM / ASSESSMENT AND PLAN / ED COURSE  I reviewed the triage vital signs and the nursing notes.                              Differential diagnosis includes, but is not limited to,  alcohol, illicit or prescription medications, or other toxic ingestion; intracranial pathology such as stroke or intracerebral hemorrhage; fever or infectious causes including sepsis; hypoxemia and/or hypercarbia; uremia; trauma; endocrine related disorders such as diabetes, hypoglycemia, and thyroid-related diseases; hypertensive encephalopathy; etc.   Patient's presentation is most consistent with acute complicated illness / injury requiring diagnostic workup.  Patient's diagnosis is consistent with ***. Patient will be discharged home with prescriptions for ***. Patient is to follow up with *** as needed or otherwise directed. Patient is given ED precautions to return to the ED for any worsening or new symptoms.     FINAL CLINICAL IMPRESSION(S) / ED DIAGNOSES   Final diagnoses:  None     Rx / DC Orders   ED Discharge Orders     None        Note:  This document was prepared using Dragon voice recognition software and may include unintentional dictation errors.

## 2024-03-28 NOTE — ED Triage Notes (Signed)
 Pt to ED POV for feeling like she could have a seizure soon. States feels like she is floating on clouds and this is how she used to feel before having partial seizures. Has been off seizure meds for 2 years. Has not passed out or had a seizure. States for last few days both hands have been twitching on and off. Speech is clear, respirations unlabored. Red top sent with basic labs. PERRL noted.
